# Patient Record
Sex: Male | Born: 1990 | Race: White | Hispanic: No | Marital: Single | Smoking: Former smoker
Health system: Southern US, Community
[De-identification: ages and names within clinical notes are randomized; demographics above are authoritative.]

## PROBLEM LIST (undated history)

## (undated) DIAGNOSIS — K760 Fatty (change of) liver, not elsewhere classified: Secondary | ICD-10-CM

## (undated) DIAGNOSIS — K449 Diaphragmatic hernia without obstruction or gangrene: Secondary | ICD-10-CM

## (undated) DIAGNOSIS — K5090 Crohn's disease, unspecified, without complications: Secondary | ICD-10-CM

## (undated) DIAGNOSIS — F32A Depression, unspecified: Secondary | ICD-10-CM

## (undated) DIAGNOSIS — K219 Gastro-esophageal reflux disease without esophagitis: Secondary | ICD-10-CM

## (undated) DIAGNOSIS — F1910 Other psychoactive substance abuse, uncomplicated: Secondary | ICD-10-CM

## (undated) DIAGNOSIS — K589 Irritable bowel syndrome without diarrhea: Secondary | ICD-10-CM

## (undated) DIAGNOSIS — F329 Major depressive disorder, single episode, unspecified: Secondary | ICD-10-CM

## (undated) HISTORY — DX: Major depressive disorder, single episode, unspecified: F32.9

## (undated) HISTORY — DX: Irritable bowel syndrome, unspecified: K58.9

## (undated) HISTORY — DX: Crohn's disease, unspecified, without complications: K50.90

## (undated) HISTORY — DX: Other psychoactive substance abuse, uncomplicated: F19.10

## (undated) HISTORY — DX: Fatty (change of) liver, not elsewhere classified: K76.0

## (undated) HISTORY — DX: Depression, unspecified: F32.A

## (undated) HISTORY — DX: Diaphragmatic hernia without obstruction or gangrene: K44.9

## (undated) HISTORY — PX: APPENDECTOMY: SHX54

## (undated) HISTORY — DX: Gastro-esophageal reflux disease without esophagitis: K21.9

---

## 2011-01-04 DIAGNOSIS — K5090 Crohn's disease, unspecified, without complications: Secondary | ICD-10-CM

## 2011-01-04 HISTORY — DX: Crohn's disease, unspecified, without complications: K50.90

## 2011-08-29 ENCOUNTER — Other Ambulatory Visit (INDEPENDENT_AMBULATORY_CARE_PROVIDER_SITE_OTHER): Payer: PRIVATE HEALTH INSURANCE

## 2011-08-29 ENCOUNTER — Encounter: Payer: Self-pay | Admitting: Gastroenterology

## 2011-08-29 ENCOUNTER — Telehealth: Payer: Self-pay | Admitting: Gastroenterology

## 2011-08-29 ENCOUNTER — Ambulatory Visit (INDEPENDENT_AMBULATORY_CARE_PROVIDER_SITE_OTHER): Payer: PRIVATE HEALTH INSURANCE | Admitting: Gastroenterology

## 2011-08-29 VITALS — BP 118/64 | HR 88 | Ht 73.0 in | Wt 176.0 lb

## 2011-08-29 DIAGNOSIS — R109 Unspecified abdominal pain: Secondary | ICD-10-CM

## 2011-08-29 DIAGNOSIS — R197 Diarrhea, unspecified: Secondary | ICD-10-CM

## 2011-08-29 DIAGNOSIS — R933 Abnormal findings on diagnostic imaging of other parts of digestive tract: Secondary | ICD-10-CM

## 2011-08-29 LAB — BASIC METABOLIC PANEL
BUN: 13 mg/dL (ref 6–23)
Chloride: 104 mEq/L (ref 96–112)
GFR: 97.47 mL/min (ref >60.00)
Glucose, Bld: 106 mg/dL — ABNORMAL HIGH (ref 70–99)
Potassium: 4 mEq/L (ref 3.5–5.1)
Sodium: 141 mEq/L (ref 135–145)

## 2011-08-29 LAB — LIPASE: Lipase: 16 U/L (ref 11.0–59.0)

## 2011-08-29 LAB — CBC WITH DIFFERENTIAL/PLATELET
Basophils Absolute: 0 10*3/uL (ref 0.0–0.1)
HCT: 38.4 % — ABNORMAL LOW (ref 39.0–52.0)
Lymphs Abs: 1.8 10*3/uL (ref 0.7–4.0)
MCV: 78.7 fl (ref 78.0–100.0)
Monocytes Absolute: 0.7 10*3/uL (ref 0.1–1.0)
Monocytes Relative: 7.1 % (ref 3.0–12.0)
Neutrophils Relative %: 70.6 % (ref 43.0–77.0)
Platelets: 340 10*3/uL (ref 150.0–400.0)
RDW: 12.5 % (ref 11.5–14.6)

## 2011-08-29 LAB — HEPATIC FUNCTION PANEL
ALT: 13 U/L (ref 0–53)
AST: 14 U/L (ref 0–37)
Albumin: 3.6 g/dL (ref 3.5–5.2)
Alkaline Phosphatase: 48 U/L (ref 39–117)

## 2011-08-29 LAB — TSH: TSH: 1.18 u[IU]/mL (ref 0.35–5.50)

## 2011-08-29 MED ORDER — MOVIPREP 100 G PO SOLR: 1.0000 | Freq: Once | ORAL | Status: DC

## 2011-08-29 NOTE — Progress Notes (Addendum)
History of Present Illness: This is a 21 year old male who relates a history of suprapubic abdominal pain for about 2 years. He underwent appendectomy in 2010 and he relates that his ongoing abdominal pain started 2011. He underwent a CT scan of the abdomen/pelvis in January 2013 in [place]-he states that exam was unremarkable. He saw his surgeon at that time and was placed on ibuprofen which he took regularly for several months and eventually he developed epigastric pain, nausea and anorexia. He saw his PCP was advised to discontinue ibuprofen and begin Nexium samples and then a prescription for pantoprazole. Since beginning Nexium his nausea, upper abdominal pain and anorexia have begun to resolve. He relates occasional episodes looser stools occurring once or twice a month but this did not correlate with his lower abdominal pain. His lower abdominal pain does not correlate with time of day, bowel movements, exercise or any other function. He states that his abdomen will hurt for several days at a time and then may not hurt for several days and then his symptoms returned. He was recently started on amitriptyline for the past 5 or 6 days with no improvement in symptoms. Denies weight loss, constipation, change in stool caliber, melena, hematochezia, vomiting, dysphagia, reflux symptoms, chest pain.  No Known Allergies No outpatient prescriptions prior to visit.   Past Medical History  Diagnosis Date  . IBS (irritable bowel syndrome)   . GERD (gastroesophageal reflux disease)    Past Surgical History  Procedure Date  . Appendectomy    History   Social History  . Marital Status: Single    Spouse Name: N/A    Number of Children: 0  . Years of Education: N/A   Occupational History  . Customer Service    Social History Main Topics  . Smoking status: Current Everyday Smoker -- 1.0 packs/day for 5 years    Types: Cigarettes  . Smokeless tobacco: Never Used  . Alcohol Use: No  . Drug Use:  No  . Sexually Active: None   Other Topics Concern  . None   Social History Narrative   Daily caffeine    Family History  Problem Relation Age of Onset  . Heart disease Father   . Diabetes type II Father   . Depression Father   . Esophageal cancer Mother   . Colon cancer Neg Hx     Review of Systems: Pertinent positive and negative review of systems were noted in the above HPI section. All other review of systems were otherwise negative.   Physical Exam: General: Well developed , well nourished, no acute distress Head: Normocephalic and atraumatic Eyes:  sclerae anicteric, EOMI Ears: Normal auditory acuity Mouth: No deformity or lesions Neck: Supple, no masses or thyromegaly Lungs: Clear throughout to auscultation Heart: Regular rate and rhythm; no murmurs, rubs or bruits Abdomen: Soft, mild suprapubic tenderness to deep palpation without rebound or guarding and non distended. No masses, hepatosplenomegaly or hernias noted. Normal Bowel sounds Musculoskeletal: Symmetrical with no gross deformities  Skin: No lesions on visible extremities Pulses:  Normal pulses noted Extremities: No clubbing, cyanosis, edema or deformities noted Neurological: Alert oriented x 4, grossly nonfocal Cervical Nodes:  No significant cervical adenopathy Inguinal Nodes: No significant inguinal adenopathy Psychological:  Alert and cooperative. Normal mood and affect  CT scan 01/24/2011 report received and reviewed: Diffuse distal ileum and terminal ileum thickening  Assessment and Recommendations:  1. Abnormal ileum on CT. Suprapubic pain. R/O ileitis, Crohn's. Possible painful adhesions. His symptoms are  not typical for irritable bowel syndrome. Obtain blood work and stool Hemoccults today. Continue amitriptyline. Schedule colonoscopy. The risks, benefits, and alternatives to colonoscopy with possible biopsy and possible polypectomy were discussed with the patient and they consent to proceed.   2.  Upper abdominal pain, nausea, anorexia. Presumably NSAID-induced gastritis or ulcer disease. Possible symptoms from ileitis, possible obstructive symptoms. Continue to avoid aspirin and NSAIDs. May use Tylenol as needed. Continue Nexium and then change to pantoprazole as recommended. Schedule upper endoscopy. The risks, benefits, and alternatives to endoscopy with possible biopsy and possible dilation were discussed with the patient and they consent to proceed.

## 2011-08-29 NOTE — Telephone Encounter (Signed)
Pt called and said his moviprep was 94.00 can he have something less expensive. I told him I will leave him a sample at our front desk.  Pt said he will come in tomorrow to pick it up.

## 2011-08-29 NOTE — Patient Instructions (Addendum)
Your physician has requested that you go to the basement for the following lab work before leaving today:TSH, Lipase, LFT's, Celiac panel, CBC, Bmet. You have been scheduled for an endoscopy and colonoscopy with propofol. Please follow the written instructions given to you at your visit today. Please pick up your prep at the pharmacy within the next 1-3 days. If you use inhalers (even only as needed), please bring them with you on the day of your procedure.  Please follow instructions on Hemoccult cards and mail them back to [place] when finished.  After you have finished taking the Nexium samples, please start taking the pantoprazole daily. Discontinue all aspiring and NSAID products.  cc: Birdena Jubilee, MD

## 2011-08-30 LAB — CELIAC PANEL 10: IgA: 210 mg/dL (ref 68–379)

## 2011-08-30 LAB — C-REACTIVE PROTEIN: CRP: 6.9 mg/dL (ref 0.5–20.0)

## 2011-09-20 ENCOUNTER — Ambulatory Visit (AMBULATORY_SURGERY_CENTER): Payer: PRIVATE HEALTH INSURANCE | Admitting: Gastroenterology

## 2011-09-20 ENCOUNTER — Encounter: Payer: Self-pay | Admitting: Gastroenterology

## 2011-09-20 VITALS — BP 137/83 | HR 108 | Temp 98.0°F | Resp 22 | Ht 73.0 in | Wt 160.0 lb

## 2011-09-20 DIAGNOSIS — K5289 Other specified noninfective gastroenteritis and colitis: Secondary | ICD-10-CM

## 2011-09-20 DIAGNOSIS — R109 Unspecified abdominal pain: Secondary | ICD-10-CM

## 2011-09-20 DIAGNOSIS — R197 Diarrhea, unspecified: Secondary | ICD-10-CM

## 2011-09-20 DIAGNOSIS — K2990 Gastroduodenitis, unspecified, without bleeding: Secondary | ICD-10-CM

## 2011-09-20 DIAGNOSIS — R933 Abnormal findings on diagnostic imaging of other parts of digestive tract: Secondary | ICD-10-CM

## 2011-09-20 DIAGNOSIS — K2970 Gastritis, unspecified, without bleeding: Secondary | ICD-10-CM

## 2011-09-20 MED ORDER — BUDESONIDE 3 MG PO CP24: 9.0000 mg | ORAL_CAPSULE | ORAL | Status: DC

## 2011-09-20 MED ORDER — SODIUM CHLORIDE 0.9 % IV SOLN: 500.0000 mL | INTRAVENOUS | Status: DC

## 2011-09-20 NOTE — Progress Notes (Signed)
The pt tolerated the colonoscopy very well. Maw   

## 2011-09-20 NOTE — Op Note (Signed)
[facility] [address] [place], [address]   COLONOSCOPY PROCEDURE REPORT  PATIENT: Manuel Hanna, Manuel Hanna  MR#: [id number] BIRTHDATE: May 24, 1990 , 20  yrs. old GENDER: Male ENDOSCOPIST: Meryl Dare, MD, [facility] REFERRED OZ:HYQMV Alberteen Sam, M.D. PROCEDURE DATE:  09/20/2011 PROCEDURE:   Colonoscopy with biopsy ASA CLASS:   Class II INDICATIONS:abdominal pain, an abnormal CT, and unexplained diarrhea. MEDICATIONS: MAC sedation, administered by CRNA and propofol (Diprivan) 500mg  IV  DESCRIPTION OF PROCEDURE:   After the risks benefits and alternatives of the procedure were thoroughly explained, informed consent was obtained.  A digital rectal exam revealed no abnormalities of the rectum.   The LB [id number]  endoscope was introduced through the anus and advanced to the terminal ileum which was intubated for a short distance. No adverse events experienced.   The quality of the prep was adequate, using MoviPrep The instrument was then slowly withdrawn as the colon was fully examined.   COLON FINDINGS: Moderate ileitis was found in the terminal ileum. Stricturing prevented deep intubation. Ulcerations, erosions and edema noted. Multiple biopsies of the area were performed. Traversable stenosis (narrowing) was found in the sigmoid colon. Erosions, induration noted. Multiple biopsies of the area were performed. The colon was otherwise normal.  There was no diverticulosis, inflammation, polyps or cancers unless previously stated.  Retroflexed views revealed no abnormalities. The time to cecum=4 minutes 19 seconds.  Withdrawal time=17 minutes 06 seconds. The scope was withdrawn and the procedure completed. COMPLICATIONS: There were no complications.  ENDOSCOPIC IMPRESSION: 1.   Ileitis, moderate; multiple biopsies performed 2.   Stenosis (narrowing) in the sigmoid colon suspected colitis; multiple biopsies performed 3.   The colon was otherwise  normal  RECOMMENDATIONS: 1.  Hold aspirin, aspirin products, and anti-inflammatory medications 2.  Await pathology results 3.  Entocord 3 mg, 3 po daily, #90, 3 refills 3.  Out patient follow-up in 4-6 weeks  eSigned:  Meryl Dare, MD, [facility] 09/20/2011 4:01 PM

## 2011-09-20 NOTE — Progress Notes (Signed)
The pt tolerated the egd very well. maw

## 2011-09-20 NOTE — Patient Instructions (Addendum)

## 2011-09-20 NOTE — Progress Notes (Signed)
Patient did not experience any of the following events: a burn prior to discharge; a fall within the facility; wrong site/side/patient/procedure/implant event; or a hospital transfer or hospital admission upon discharge from the facility. (G8907) Patient did not have preoperative order for IV antibiotic SSI prophylaxis. (G8918)  

## 2011-09-20 NOTE — Progress Notes (Signed)
15:55 Marrion Coy, CRNA hung 2nd bag of normal saline 0.9% 500 ml. Maw

## 2011-09-20 NOTE — Op Note (Signed)
[facility] [address] [place], [address]   ENDOSCOPY PROCEDURE REPORT  PATIENT: Manuel Hanna, Manuel Hanna  MR#: [id number] BIRTHDATE: 15-Apr-1990 , 20  yrs. old GENDER: Male ENDOSCOPIST: Meryl Dare, MD, [facility] REFERRED BY:  Birdena Jubilee, M.D. PROCEDURE DATE:  09/20/2011 PROCEDURE:  EGD w/ biopsy ASA CLASS:     Class II INDICATIONS:  abdominal pain. MEDICATIONS: MAC sedation, administered by CRNA, residual sedation effect present from prior procedure, and propofol (Diprivan) 250mg  IV TOPICAL ANESTHETIC: Cetacaine Spray DESCRIPTION OF PROCEDURE: After the risks benefits and alternatives of the procedure were thoroughly explained, informed consent was obtained.  The [facility] GIF-H180 [id number] endoscope was introduced through the mouth and advanced to the second portion of the duodenum. Without limitations.  The instrument was slowly withdrawn as the mucosa was fully examined.   STOMACH: Moderate gastritis (inflammation) was found in the gastric fundus.  Multiple biopsies were performed.   The stomach otherwise appeared normal. ESOPHAGUS: The mucosa of the esophagus appeared normal. DUODENUM: The duodenal mucosa showed no abnormalities in the bulb and second portion of the duodenum.  Retroflexed views revealed a hiatal hernia.     The scope was then withdrawn from the patient and the procedure completed.  COMPLICATIONS: There were no complications. ENDOSCOPIC IMPRESSION: 1.   Gastritis (inflammation) was found in the gastric fundus; multiple biopsies 2.   The stomach otherwise appeared normal 3.   The mucosa of the esophagus appeared normal 4.   The duodenal mucosa showed no abnormalities in the bulb and second portion of the duodenum  RECOMMENDATIONS: 1.  anti-reflux regimen 2.  Avoid ASA/NSAIDS 3.  await pathology results 4.  continue PPI    eSigned:  Meryl Dare, MD, [facility] 09/20/2011 4:09 PM

## 2011-09-21 ENCOUNTER — Telehealth: Payer: Self-pay

## 2011-09-21 ENCOUNTER — Telehealth: Payer: Self-pay | Admitting: Gastroenterology

## 2011-09-21 MED ORDER — PREDNISONE 10 MG PO TABS: ORAL_TABLET | ORAL | Status: DC

## 2011-09-21 NOTE — Telephone Encounter (Signed)
Left a message for patient to return my call. 

## 2011-09-21 NOTE — Telephone Encounter (Signed)
Patient states he does not really have a prescription coverage plan so his generic Entocort was going to be over a thousand dollars and he cannot afford this medication. Told patient there is not many other medications that are like Budesonide. Told patient maybe we can send in Uceris to see if this is any cheaper for patient if this is ok with Dr. Russella Dar. Please advise.

## 2011-09-21 NOTE — Telephone Encounter (Signed)
  Follow up Call-  Call back number 09/20/2011  Post procedure Call Back phone  # [phone]  Permission to leave phone message Yes     Patient questions:  Do you have a fever, pain , or abdominal swelling? no Pain Score  0 *  Have you tolerated food without any problems? yes  Have you been able to return to your normal activities? yes  Do you have any questions about your discharge instructions: Diet   no Medications  no Follow up visit  no  Do you have questions or concerns about your Care? no  Actions: * If pain score is 4 or above: No action needed, pain <4.  The pt said the rx prescribed yesterday cost too much and would like another rx.  I asked the pt to call the 3rd floor and leave a message for Dr. Russella Dar.maw

## 2011-09-21 NOTE — Telephone Encounter (Signed)
Prednisone 60 mg daily for 4 days, then 40 mg po qd for 2 weeks and then decrease to 30 mg qd. Will modify at REV.

## 2011-09-22 NOTE — Telephone Encounter (Signed)
Patient notified of tapered prednisone dose and prescription sent to the pharmacy. Told patient to stay on 30 mg dose until office visit which we scheduled for 10/25/11 at 9:15am. Pt agreed and verbalized understanding.

## 2011-09-27 ENCOUNTER — Encounter: Payer: Self-pay | Admitting: Gastroenterology

## 2011-10-12 ENCOUNTER — Telehealth: Payer: Self-pay | Admitting: Gastroenterology

## 2011-10-12 NOTE — Telephone Encounter (Signed)
Left a message for patient to return my call. 

## 2011-10-12 NOTE — Telephone Encounter (Signed)
Dr. Alberteen Sam called and I returned her call at 1600. Pt was seen by her last Friday having ongoing abdominal pain. She added Cipro, Flagyl and Bentyl. We will contact him to reassess and see if he needs a sooner appt., possibly a work in with one of our extenders. Increase his Prednisone to 50 mg po daily for 5 days and then 40 mg po daily until return visit with me. Take Cipro and Flagyl for 10 days and then discontinue. OK to continue to use Bentyl if it helps.

## 2011-10-13 NOTE — Telephone Encounter (Signed)
Patient states he has not started the Cipro and Flagyl because he wanted to wait until he spoke to our office. He is still having intermittant abdominal pain. Told him to start the antibiotics for 10 days and increase his prednisone according to Dr. Ardell Isaacs recommendations. Told him to continue Bentyl if this does help. Patient does have an appointment with you on 10/25/11. Told patient we can work him in with an PA if his symptoms do not improve once starting the antibiotics. Patient agreed and will call back if his symptoms do get worse or fail to improve after increasing the prednisone or taking the Cipro and Flagyl.

## 2011-10-25 ENCOUNTER — Other Ambulatory Visit (INDEPENDENT_AMBULATORY_CARE_PROVIDER_SITE_OTHER): Payer: PRIVATE HEALTH INSURANCE

## 2011-10-25 ENCOUNTER — Encounter: Payer: Self-pay | Admitting: Gastroenterology

## 2011-10-25 ENCOUNTER — Ambulatory Visit (INDEPENDENT_AMBULATORY_CARE_PROVIDER_SITE_OTHER): Payer: PRIVATE HEALTH INSURANCE | Admitting: Gastroenterology

## 2011-10-25 VITALS — BP 120/72 | HR 100 | Ht 72.0 in | Wt 179.4 lb

## 2011-10-25 DIAGNOSIS — K5080 Crohn's disease of both small and large intestine without complications: Secondary | ICD-10-CM

## 2011-10-25 DIAGNOSIS — R109 Unspecified abdominal pain: Secondary | ICD-10-CM

## 2011-10-25 MED ORDER — PREDNISONE 10 MG PO TABS: ORAL_TABLET | ORAL | Status: DC

## 2011-10-25 NOTE — Progress Notes (Signed)
History of Present Illness: This is a 21 year old male here today with his mother. He was recently diagnosed with Crohn's ileocolitis. He was unable to afford budesonide. He has no prescription medication coverage. He's currently taking prednisone 40 mg daily and he has had a substantial improvement. He's gained 20 pounds. His abdominal pain is only mild and intermittent occurring maybe once or twice a week in the periumbilical and suprapubic areas. His bowel movements are well-formed and he is sleeping well.  Current Medications, Allergies, Past Medical History, Past Surgical History, Family History and Social History were reviewed in [employer] record.  Physical Exam: General: Well developed , well nourished, no acute distress Head: Normocephalic and atraumatic Eyes:  sclerae anicteric, EOMI Ears: Normal auditory acuity Mouth: No deformity or lesions Lungs: Clear throughout to auscultation Heart: Regular rate and rhythm; no murmurs, rubs or bruits Abdomen: Soft, non tender and non distended. No masses, hepatosplenomegaly or hernias noted. Normal Bowel sounds Musculoskeletal: Symmetrical with no gross deformities  Pulses:  Normal pulses noted Extremities: No clubbing, cyanosis, edema or deformities noted Neurological: Alert oriented x 4, grossly nonfocal Psychological:  Alert and cooperative. Normal mood and affect  Assessment and Recommendations:  1. Crohn's ileocolitis. Substantial improvement on prednisone. Unfortunately he cannot afford budesonide. Continue prednisone 40 mg daily. Consider tapering prednisone at his next office visit. Discussed 6-mercaptopurine it's risks, benefits and alternatives. Discussed therapy with biologics and their risks, benefits and alternatives. Due to cost he would prefer to avoid biologics at this time The patient is comfortable proceeding with . Will obtain TPMT enzyme activity levels from [facility]. Obtain B12 level. Followup office  visit 3 weeks.

## 2011-10-25 NOTE — Patient Instructions (Addendum)
You have been given a separate informational sheet regarding your tobacco use, the importance of quitting and local resources to help you quit.  Please go to our basement level to have your labs drawn today. We will call you with the results.

## 2011-10-28 LAB — VITAMIN B12: Vitamin B-12: 294 pg/mL (ref 211–911)

## 2011-11-02 ENCOUNTER — Other Ambulatory Visit: Payer: Self-pay

## 2011-11-02 MED ORDER — MERCAPTOPURINE 50 MG PO TABS: 75.0000 mg | ORAL_TABLET | Freq: Every day | ORAL | Status: DC

## 2011-11-02 NOTE — Progress Notes (Signed)
Ok to increase PPI to BID

## 2011-11-03 ENCOUNTER — Telehealth: Payer: Self-pay | Admitting: Gastroenterology

## 2011-11-03 MED ORDER — MERCAPTOPURINE 50 MG PO TABS: 75.0000 mg | ORAL_TABLET | Freq: Every day | ORAL | Status: DC

## 2011-11-03 NOTE — Telephone Encounter (Signed)
Prescription resent to [facility] in [place]. Left a message to notify patient that the prescription was sent.

## 2011-11-09 ENCOUNTER — Ambulatory Visit (INDEPENDENT_AMBULATORY_CARE_PROVIDER_SITE_OTHER): Payer: PRIVATE HEALTH INSURANCE | Admitting: Gastroenterology

## 2011-11-09 ENCOUNTER — Encounter: Payer: Self-pay | Admitting: Gastroenterology

## 2011-11-09 ENCOUNTER — Other Ambulatory Visit: Payer: Self-pay | Admitting: Gastroenterology

## 2011-11-09 VITALS — BP 110/80 | HR 103 | Ht 72.0 in | Wt 187.4 lb

## 2011-11-09 DIAGNOSIS — K5080 Crohn's disease of both small and large intestine without complications: Secondary | ICD-10-CM

## 2011-11-09 MED ORDER — PREDNISONE 10 MG PO TABS: ORAL_TABLET | ORAL | Status: DC

## 2011-11-09 NOTE — Telephone Encounter (Signed)
Prescription sent to [facility] pharmacy.

## 2011-11-09 NOTE — Progress Notes (Signed)
History of Present Illness: This is a 21 year old male with Crohn's ileocolitis is accompanied by his mother today. He has had substantial improvement in symptoms over the past several weeks on prednisone. He still notes intermittent mild suprapubic pain. His appetite has improved substantially and he has gained about 11 pounds. He started 6-MP last week. Denies weight loss, constipation, diarrhea, change in stool caliber, melena, hematochezia, nausea, vomiting, dysphagia, reflux symptoms, chest pain.  Current Medications, Allergies, Past Medical History, Past Surgical History, Family History and Social History were reviewed in [employer] record.  Physical Exam: General: Well developed , well nourished, no acute distress Head: Normocephalic and atraumatic Eyes:  sclerae anicteric, EOMI Ears: Normal auditory acuity Mouth: No deformity or lesions Lungs: Clear throughout to auscultation Heart: Regular rate and rhythm; no murmurs, rubs or bruits Abdomen: Soft, non tender and non distended. No masses, hepatosplenomegaly or hernias noted. Normal Bowel sounds Musculoskeletal: Symmetrical with no gross deformities  Pulses:  Normal pulses noted Extremities: No clubbing, cyanosis, edema or deformities noted Neurological: Alert oriented x 4, grossly nonfocal Psychological:  Alert and cooperative. Normal mood and affect  Assessment and Recommendations:  1. Crohn's ileocolitis. Clinical course has substantially improved. On corticosteroids and 6-MP. Decrease prednisone to 30 mg daily. He will need to continue prednisone for 3 months of 6-MP therapy and that we can taper corticosteroids. He was again offered the option of beginning a biologic and he declines. Obtain blood work one month from starting 6-MP. Return office visit 2 months.

## 2011-11-09 NOTE — Patient Instructions (Signed)
Please go directly to the basement to have your labs drawn on the week of December 2nd.  Decrease your prednisone to 30 mg daily until your office visit in 2 months. We have sent a new prescription for prednisone.

## 2011-11-17 ENCOUNTER — Telehealth: Payer: Self-pay | Admitting: Gastroenterology

## 2011-11-17 NOTE — Telephone Encounter (Signed)
Pt has been taking mercaptopurine for about at week and a half. States he noticed a rash starting 2-3 days ago. Pt states he has a red rash around his collar bone, around his neck, down to his shoulder blades. States it itches and hurts. Instructed pt to stop the medicine and that he may want to take some benadryl for the itching. Dr. Russella Dar please advise.

## 2011-11-18 NOTE — Telephone Encounter (Signed)
DC HBsAg and PPD (or the TB blood test) Discuss biologic med coverage available to him

## 2011-11-18 NOTE — Telephone Encounter (Signed)
Patient advised to stop 6 MP and list as an allergy.  Patient reports that after he has had a night to think about it he wonders if the rash was from chemicals at work.  He would like to try 6 MP again.  Discussed with Dr. Russella Dar, he is ok with patient trying the 6 MP after rash has been completely gone for 1 week.  The patient is warned that he will need benadryl available when he tries if again because if the reaction was to the medication it could be much worse when he tries it again.  He understands and wants to try.  He also understands that if the rash returns when he tries 6 MP again we will need to look at biologic therapy.  Will hold off on the labs and TB until we talk again in a few weeks

## 2011-12-06 ENCOUNTER — Telehealth: Payer: Self-pay

## 2011-12-06 NOTE — Telephone Encounter (Signed)
I called patient to check on his rash and inquire if he was able to resume .  He reports that he started back on 2 weeks ago and no problems.  He will be due for labs the week of 12/19/11.  He is scheduled for REV 01/13/12 2:00

## 2011-12-23 ENCOUNTER — Encounter: Payer: Self-pay | Admitting: Gastroenterology

## 2011-12-23 NOTE — Telephone Encounter (Signed)
Error

## 2011-12-26 ENCOUNTER — Other Ambulatory Visit (INDEPENDENT_AMBULATORY_CARE_PROVIDER_SITE_OTHER): Payer: PRIVATE HEALTH INSURANCE

## 2011-12-26 DIAGNOSIS — K5080 Crohn's disease of both small and large intestine without complications: Secondary | ICD-10-CM

## 2011-12-26 LAB — CBC WITH DIFFERENTIAL/PLATELET
Basophils Relative: 0.3 % (ref 0.0–3.0)
Eosinophils Absolute: 0 10*3/uL (ref 0.0–0.7)
HCT: 44.5 % (ref 39.0–52.0)
Hemoglobin: 14.9 g/dL (ref 13.0–17.0)
Lymphs Abs: 1.6 10*3/uL (ref 0.7–4.0)
MCHC: 33.5 g/dL (ref 30.0–36.0)
MCV: 85.3 fl (ref 78.0–100.0)
Monocytes Absolute: 0.6 10*3/uL (ref 0.1–1.0)
Neutro Abs: 14.4 10*3/uL — ABNORMAL HIGH (ref 1.4–7.7)
RBC: 5.22 Mil/uL (ref 4.22–5.81)

## 2011-12-26 LAB — COMPREHENSIVE METABOLIC PANEL WITH GFR
ALT: 55 U/L — ABNORMAL HIGH (ref 0–53)
AST: 19 U/L (ref 0–37)
Albumin: 4.4 g/dL (ref 3.5–5.2)
Alkaline Phosphatase: 36 U/L — ABNORMAL LOW (ref 39–117)
BUN: 12 mg/dL (ref 6–23)
CO2: 28 meq/L (ref 19–32)
Calcium: 9.6 mg/dL (ref 8.4–10.5)
Chloride: 102 meq/L (ref 96–112)
Creatinine, Ser: 0.9 mg/dL (ref 0.4–1.5)
GFR: 106.9 mL/min
Glucose, Bld: 158 mg/dL — ABNORMAL HIGH (ref 70–99)
Potassium: 4.6 meq/L (ref 3.5–5.1)
Sodium: 139 meq/L (ref 135–145)
Total Bilirubin: 0.7 mg/dL (ref 0.3–1.2)
Total Protein: 7.4 g/dL (ref 6.0–8.3)

## 2011-12-26 LAB — LIPASE: Lipase: 24 U/L (ref 11.0–59.0)

## 2011-12-27 ENCOUNTER — Other Ambulatory Visit: Payer: Self-pay | Admitting: *Deleted

## 2011-12-27 DIAGNOSIS — K5010 Crohn's disease of large intestine without complications: Secondary | ICD-10-CM

## 2012-01-09 ENCOUNTER — Other Ambulatory Visit: Payer: Self-pay

## 2012-01-09 MED ORDER — PREDNISONE 10 MG PO TABS: ORAL_TABLET | ORAL | Status: DC

## 2012-01-11 ENCOUNTER — Ambulatory Visit: Payer: PRIVATE HEALTH INSURANCE

## 2012-01-11 DIAGNOSIS — R7309 Other abnormal glucose: Secondary | ICD-10-CM

## 2012-01-11 DIAGNOSIS — K5010 Crohn's disease of large intestine without complications: Secondary | ICD-10-CM

## 2012-01-11 LAB — CBC WITH DIFFERENTIAL/PLATELET
Basophils Absolute: 0 10*3/uL (ref 0.0–0.1)
Eosinophils Relative: 0.3 % (ref 0.0–5.0)
HCT: 41.4 % (ref 39.0–52.0)
Lymphs Abs: 1.8 10*3/uL (ref 0.7–4.0)
MCV: 86.2 fl (ref 78.0–100.0)
Monocytes Absolute: 0.5 10*3/uL (ref 0.1–1.0)
Neutrophils Relative %: 83.7 % — ABNORMAL HIGH (ref 43.0–77.0)
Platelets: 291 10*3/uL (ref 150.0–400.0)
RDW: 15.1 % — ABNORMAL HIGH (ref 11.5–14.6)
WBC: 14.4 10*3/uL — ABNORMAL HIGH (ref 4.5–10.5)

## 2012-01-11 LAB — COMPREHENSIVE METABOLIC PANEL
ALT: 109 U/L — ABNORMAL HIGH (ref 0–53)
Albumin: 4.1 g/dL (ref 3.5–5.2)
Alkaline Phosphatase: 36 U/L — ABNORMAL LOW (ref 39–117)
CO2: 30 mEq/L (ref 19–32)
GFR: 106.86 mL/min (ref >60.00)
Glucose, Bld: 90 mg/dL (ref 70–99)
Potassium: 4.4 mEq/L (ref 3.5–5.1)
Sodium: 139 mEq/L (ref 135–145)
Total Protein: 7.3 g/dL (ref 6.0–8.3)

## 2012-01-12 ENCOUNTER — Other Ambulatory Visit: Payer: Self-pay

## 2012-01-13 ENCOUNTER — Ambulatory Visit: Payer: PRIVATE HEALTH INSURANCE | Admitting: Gastroenterology

## 2012-01-13 ENCOUNTER — Telehealth: Payer: Self-pay | Admitting: Gastroenterology

## 2012-01-13 NOTE — Telephone Encounter (Signed)
No charge. 

## 2012-01-13 NOTE — Telephone Encounter (Signed)
Dr. Russella Dar I know we added blood tests and an Ultrasound for Manuel Hanna to do yesterday. U want to see him after these results come back right so no charge right?

## 2012-01-17 ENCOUNTER — Other Ambulatory Visit: Payer: PRIVATE HEALTH INSURANCE

## 2012-01-17 ENCOUNTER — Ambulatory Visit (HOSPITAL_COMMUNITY)
Admission: RE | Admit: 2012-01-17 | Discharge: 2012-01-17 | Disposition: A | Discharge status: Home / Self Care | Payer: PRIVATE HEALTH INSURANCE | Source: Ambulatory Visit | Attending: Gastroenterology | Admitting: Gastroenterology

## 2012-01-17 DIAGNOSIS — R160 Hepatomegaly, not elsewhere classified: Secondary | ICD-10-CM | POA: Insufficient documentation

## 2012-01-17 DIAGNOSIS — R748 Abnormal levels of other serum enzymes: Secondary | ICD-10-CM | POA: Insufficient documentation

## 2012-01-17 LAB — HEPATITIS C ANTIBODY: HCV Ab: NEGATIVE

## 2012-01-18 LAB — HEPATITIS B SURFACE ANTIBODY, QUANTITATIVE: Hepatitis B-Post: 0.1 m[IU]/mL

## 2012-01-18 LAB — HEPATITIS A ANTIBODY, TOTAL: Hep A Total Ab: NEGATIVE

## 2012-01-31 ENCOUNTER — Ambulatory Visit (INDEPENDENT_AMBULATORY_CARE_PROVIDER_SITE_OTHER): Payer: PRIVATE HEALTH INSURANCE | Admitting: Gastroenterology

## 2012-01-31 ENCOUNTER — Encounter: Payer: Self-pay | Admitting: Gastroenterology

## 2012-01-31 ENCOUNTER — Other Ambulatory Visit (INDEPENDENT_AMBULATORY_CARE_PROVIDER_SITE_OTHER): Payer: PRIVATE HEALTH INSURANCE

## 2012-01-31 VITALS — BP 120/78 | HR 72 | Ht 72.0 in | Wt 204.0 lb

## 2012-01-31 DIAGNOSIS — R7401 Elevation of levels of liver transaminase levels: Secondary | ICD-10-CM

## 2012-01-31 DIAGNOSIS — K5080 Crohn's disease of both small and large intestine without complications: Secondary | ICD-10-CM

## 2012-01-31 DIAGNOSIS — K7689 Other specified diseases of liver: Secondary | ICD-10-CM

## 2012-01-31 DIAGNOSIS — K760 Fatty (change of) liver, not elsewhere classified: Secondary | ICD-10-CM

## 2012-01-31 LAB — COMPREHENSIVE METABOLIC PANEL
Albumin: 4.2 g/dL (ref 3.5–5.2)
Alkaline Phosphatase: 38 U/L — ABNORMAL LOW (ref 39–117)
BUN: 12 mg/dL (ref 6–23)
Calcium: 9.5 mg/dL (ref 8.4–10.5)
Chloride: 102 mEq/L (ref 96–112)
Creatinine, Ser: 0.9 mg/dL (ref 0.4–1.5)
Glucose, Bld: 111 mg/dL — ABNORMAL HIGH (ref 70–99)
Potassium: 4.5 mEq/L (ref 3.5–5.1)

## 2012-01-31 LAB — CBC WITH DIFFERENTIAL/PLATELET
Basophils Relative: 0.5 % (ref 0.0–3.0)
Eosinophils Relative: 0.5 % (ref 0.0–5.0)
Hemoglobin: 14.6 g/dL (ref 13.0–17.0)
Lymphocytes Relative: 12.9 % (ref 12.0–46.0)
Neutro Abs: 8.8 10*3/uL — ABNORMAL HIGH (ref 1.4–7.7)
Neutrophils Relative %: 78.8 % — ABNORMAL HIGH (ref 43.0–77.0)
RBC: 4.99 Mil/uL (ref 4.22–5.81)
WBC: 11.1 10*3/uL — ABNORMAL HIGH (ref 4.5–10.5)

## 2012-01-31 NOTE — Progress Notes (Signed)
History of Present Illness: This is a 22 year old male with Crohn's ileocolitis recently started on 6-MP and continuing on prednisone. He is accompanied by his mother. Mildly elevated LFTs within noted since beginning prednisone and 6 MP. He has no gastrointestinal complaints he states his weight is back to his baseline.  Current Medications, Allergies, Past Medical History, Past Surgical History, Family History and Social History were reviewed in [employer] record.  Physical Exam: General: Well developed , well nourished, no acute distress Head: Normocephalic and atraumatic Eyes:  sclerae anicteric, EOMI Ears: Normal auditory acuity Mouth: No deformity or lesions Lungs: Clear throughout to auscultation Heart: Regular rate and rhythm; no murmurs, rubs or bruits Abdomen: Soft, non tender and non distended. No masses, hepatosplenomegaly or hernias noted. Normal Bowel sounds Musculoskeletal: Symmetrical with no gross deformities  Pulses:  Normal pulses noted Extremities: No clubbing, cyanosis, edema or deformities noted Neurological: Alert oriented x 4, grossly nonfocal Psychological:  Alert and cooperative. Normal mood and affect  Assessment and Recommendations:  1. Crohn's ileocolitis. Clinical course has substantially improved. On 6-MP. Continue prednisone to 30 mg daily. Obtain blood work one month from starting 6-MP. Return office visit 1 months.  2. Elevated transaminases. New since beginning prednisone and 6 MP. Fatty liver noted on ultrasound. Hepatic serologies all negative. Monitor LFTs. Hopefully and they sent this elevated LFTs are secondary to steatosis they may improve off prednisone. 6 MP could also be the cause. Plan for a prednisone taper as outlined below and maintain 6-MP monotherapy. Return office is a one-month

## 2012-01-31 NOTE — Patient Instructions (Addendum)
You have been given a separate informational sheet regarding your tobacco use, the importance of quitting and local resources to help you quit.  Decrease your prednisone to 20 mg once daily x 5 days, then reduce to 10 mg once daily x 5 days, then reduce to 10 mg every other day x 10 days, then stop.  Stay on 6mp and current dose.   Your physician has requested that you go to the basement for the following lab work before leaving today: CBC, Cmet, Lipase.  cc: Birdena Jubilee, MD

## 2012-02-10 ENCOUNTER — Telehealth: Payer: Self-pay

## 2012-02-10 NOTE — Telephone Encounter (Signed)
Patient wants to know if he can get a flu shot while taking mercaptopurine and prednisone.

## 2012-02-10 NOTE — Telephone Encounter (Signed)
Patient advised he should have flu shot annually.  He is offered an appt here but he declines, he wants to go to a [facility] closer to home.

## 2012-02-14 ENCOUNTER — Telehealth: Payer: Self-pay | Admitting: Gastroenterology

## 2012-02-14 MED ORDER — MERCAPTOPURINE 50 MG PO TABS: 75.0000 mg | ORAL_TABLET | Freq: Every day | ORAL | Status: DC

## 2012-02-14 NOTE — Telephone Encounter (Signed)
Told patient that Dr. Russella Dar was thinking about decreasing 6mp at last office visit but we did get repeat blood work with his LFT's improving. Told him that Dr. Russella Dar wants to continue 6mp on current dose for right now until next office visit. Refill sent to patient's pharmacy.

## 2012-03-05 ENCOUNTER — Encounter: Payer: Self-pay | Admitting: Gastroenterology

## 2012-03-05 ENCOUNTER — Other Ambulatory Visit: Payer: Self-pay | Admitting: Gastroenterology

## 2012-03-05 ENCOUNTER — Other Ambulatory Visit (INDEPENDENT_AMBULATORY_CARE_PROVIDER_SITE_OTHER): Payer: PRIVATE HEALTH INSURANCE

## 2012-03-05 ENCOUNTER — Ambulatory Visit (INDEPENDENT_AMBULATORY_CARE_PROVIDER_SITE_OTHER): Payer: PRIVATE HEALTH INSURANCE | Admitting: Gastroenterology

## 2012-03-05 VITALS — BP 120/74 | HR 80 | Wt 208.2 lb

## 2012-03-05 DIAGNOSIS — K5080 Crohn's disease of both small and large intestine without complications: Secondary | ICD-10-CM

## 2012-03-05 LAB — CBC WITH DIFFERENTIAL/PLATELET
Basophils Relative: 1 % (ref 0.0–3.0)
Eosinophils Relative: 0.5 % (ref 0.0–5.0)
HCT: 38.6 % — ABNORMAL LOW (ref 39.0–52.0)
Hemoglobin: 13.2 g/dL (ref 13.0–17.0)
Lymphs Abs: 1.2 10*3/uL (ref 0.7–4.0)
MCV: 86.8 fl (ref 78.0–100.0)
Monocytes Absolute: 0.7 10*3/uL (ref 0.1–1.0)
Monocytes Relative: 6.5 % (ref 3.0–12.0)
Neutro Abs: 8.4 10*3/uL — ABNORMAL HIGH (ref 1.4–7.7)
Platelets: 271 10*3/uL (ref 150.0–400.0)
WBC: 10.5 10*3/uL (ref 4.5–10.5)

## 2012-03-05 LAB — COMPREHENSIVE METABOLIC PANEL
Alkaline Phosphatase: 45 U/L (ref 39–117)
BUN: 12 mg/dL (ref 6–23)
CO2: 27 mEq/L (ref 19–32)
Creatinine, Ser: 0.9 mg/dL (ref 0.4–1.5)
GFR: 109.43 mL/min (ref >60.00)
Glucose, Bld: 111 mg/dL — ABNORMAL HIGH (ref 70–99)
Sodium: 140 mEq/L (ref 135–145)
Total Bilirubin: 0.8 mg/dL (ref 0.3–1.2)
Total Protein: 7.1 g/dL (ref 6.0–8.3)

## 2012-03-05 LAB — LIPASE: Lipase: 20 U/L (ref 11.0–59.0)

## 2012-03-05 MED ORDER — MERCAPTOPURINE 50 MG PO TABS: 100.0000 mg | ORAL_TABLET | Freq: Every day | ORAL | Status: DC

## 2012-03-05 NOTE — Patient Instructions (Addendum)
Your physician has requested that you go to the basement for the following lab work before leaving today: CBC, Cmet, Lipase.   Increase your 6mp to (100mg ) 2 tablets by mouth daily. A new prescription has been sent to your pharmacy.   cc: Birdena Jubilee, MD

## 2012-03-05 NOTE — Progress Notes (Signed)
History of Present Illness: This is a 22 year old male with Crohn's ileocolitis. Prednisone was tapered and discontinued about 2 or 3 weeks ago. Over the past 1-2 weeks he iha noted a return of mild suprapubic pain and he has had occasional episodes of diarrhea. His LFTs have improved.  Current Medications, Allergies, Past Medical History, Past Surgical History, Family History and Social History were reviewed in [employer] record.  Physical Exam: General: Well developed , well nourished, no acute distress Head: Normocephalic and atraumatic Eyes:  sclerae anicteric, EOMI Ears: Normal auditory acuity Mouth: No deformity or lesions Lungs: Clear throughout to auscultation Heart: Regular rate and rhythm; no murmurs, rubs or bruits Abdomen: Soft, minimal suprapubic tenderness to deep palpation and non distended. No masses, hepatosplenomegaly or hernias noted. Normal Bowel sounds Musculoskeletal: Symmetrical with no gross deformities  Pulses:  Normal pulses noted Extremities: No clubbing, cyanosis, edema or deformities noted Neurological: Alert oriented x 4, grossly nonfocal Psychological:  Alert and cooperative. Normal mood and affect  Assessment and Recommendations:  1. Crohn's ileocolitis. Clinical course has substantially improved however since discontinuing prednisone he has mild recurrent suprapubic pain and occasional loose stools which are typical of his prior symptoms. Given his weight increase and recurrent symptoms will increase 6 MP to 100 mg daily. Consider resuming Entocort however he does not have insurance coverage for medications. I advised him if 6-MP alone cannot manage his disease we will probably recommend adding a biologic however he does not have insurance coverage for medications. Return office visit 1 month.   2. Elevated transaminases. Fatty liver noted on ultrasound. Hepatic serologies all negative. Monitor LFTs. Hopefully elevated LFTs are secondary  to steatosis they may improve off prednisone.6 MP could also be the cause. He has had a substantial weight increase while taking prednisone.

## 2012-04-09 ENCOUNTER — Encounter: Payer: Self-pay | Admitting: Gastroenterology

## 2012-04-09 ENCOUNTER — Ambulatory Visit (INDEPENDENT_AMBULATORY_CARE_PROVIDER_SITE_OTHER): Payer: PRIVATE HEALTH INSURANCE | Admitting: Gastroenterology

## 2012-04-09 ENCOUNTER — Other Ambulatory Visit (INDEPENDENT_AMBULATORY_CARE_PROVIDER_SITE_OTHER): Payer: PRIVATE HEALTH INSURANCE

## 2012-04-09 VITALS — BP 126/90 | HR 68 | Ht 72.0 in | Wt 204.8 lb

## 2012-04-09 DIAGNOSIS — K5090 Crohn's disease, unspecified, without complications: Secondary | ICD-10-CM

## 2012-04-09 LAB — CBC WITH DIFFERENTIAL/PLATELET
Basophils Relative: 0.3 % (ref 0.0–3.0)
Eosinophils Relative: 0.7 % (ref 0.0–5.0)
Hemoglobin: 14.4 g/dL (ref 13.0–17.0)
MCV: 87.8 fl (ref 78.0–100.0)
Monocytes Absolute: 0.5 10*3/uL (ref 0.1–1.0)
Neutro Abs: 6.5 10*3/uL (ref 1.4–7.7)
Neutrophils Relative %: 77.3 % — ABNORMAL HIGH (ref 43.0–77.0)
RBC: 4.88 Mil/uL (ref 4.22–5.81)
WBC: 8.5 10*3/uL (ref 4.5–10.5)

## 2012-04-09 LAB — COMPREHENSIVE METABOLIC PANEL
Albumin: 4.4 g/dL (ref 3.5–5.2)
Alkaline Phosphatase: 42 U/L (ref 39–117)
BUN: 9 mg/dL (ref 6–23)
Calcium: 9.4 mg/dL (ref 8.4–10.5)
Chloride: 104 mEq/L (ref 96–112)
Glucose, Bld: 114 mg/dL — ABNORMAL HIGH (ref 70–99)
Potassium: 4.6 mEq/L (ref 3.5–5.1)

## 2012-04-09 NOTE — Progress Notes (Signed)
History of Present Illness: This is a 22 year old male with a history of Crohn's ileocolitis returning for followup today accompanied by his mother. He relates occasional episodes of mild suprapubic pain and loose stools. The symptoms occur about one time per week. They are described as mild however his symptoms were under better control when he was on prednisone. 6 MP was increased to 100 mg daily one month ago. His weight is stable his appetite is good. His energy level is good. His symptoms do not impact his work or other activities.  Current Medications, Allergies, Past Medical History, Past Surgical History, Family History and Social History were reviewed in [employer] record.  Physical Exam: General: Well developed , well nourished, no acute distress Head: Normocephalic and atraumatic Eyes:  sclerae anicteric, EOMI Ears: Normal auditory acuity Mouth: No deformity or lesions Lungs: Clear throughout to auscultation Heart: Regular rate and rhythm; no murmurs, rubs or bruits Abdomen: Soft, non tender and non distended. No masses, hepatosplenomegaly or hernias noted. Normal Bowel sounds Musculoskeletal: Symmetrical with no gross deformities  Pulses:  Normal pulses noted Extremities: No clubbing, cyanosis, edema or deformities noted Neurological: Alert oriented x 4, grossly nonfocal Psychological:  Alert and cooperative. Normal mood and affect  Assessment and Recommendations:  1. Crohn's ileocolitis. Clinical course has substantially improved however since discontinuing prednisone he has mild recurrent suprapubic pain and occasional loose stools which are typical of his prior symptoms. Consider resuming Entocort if he can get the medication through a patient assistance program. We've also applied to get Humira through a patient assistance program. Return office visit in 2 month. If he can get Entocort I would recommend least an 8 week course while we await the three-month  point of his increased dose of mercaptopurine. If Entocort fails, or he is unable to get it, and if higher dose of 6-MP does not work will consider biologic therapy. Obtain blood work today.   2. Elevated transaminases. Fatty liver noted on ultrasound. Hepatic serologies all negative. Monitor LFTs. Hopefully elevated LFTs are secondary to steatosis they may improve off prednisone.6 MP could also be the cause. He has had a substantial weight increase while taking prednisone.

## 2012-04-09 NOTE — Patient Instructions (Addendum)
  You have been given a separate informational sheet regarding your tobacco use, the importance of quitting and local resources to help you quit.  Your physician has requested that you go to the basement for lab work before leaving today.  Please make a follow up visit with Dr. Russella Dar in two months.  Thank you for choosing [facility] and Dr. Russella Dar. ____________________________________________________________________________                                               We are excited to introduce MyChart, a new best-in-class service that provides you online access to important information in your electronic medical record. We want to make it easier for you to view your health information - all in one secure location - when and where you need it. We expect MyChart will enhance the quality of care and service we provide.  When you register for MyChart, you can:    View your test results.    Request appointments and receive appointment reminders via email.    Request medication renewals.    View your medical history, allergies, medications and immunizations.    Communicate with your physician's office through a password-protected site.    Conveniently print information such as your medication lists.  To find out if MyChart is right for you, please talk to a member of our clinical staff today. We will gladly answer your questions about this free health and wellness tool.  If you are age 14 or older and want a member of your family to have access to your record, you must provide written consent by completing a proxy form available at our office. Please speak to our clinical staff about guidelines regarding accounts for patients younger than age 5.  As you activate your MyChart account and need any technical assistance, please call the MyChart technical support line at [phone] [phone]) or email your question to [email or web address]. If you email your  question(s), please include your name, a return phone number and the best time to reach you.  If you have non-urgent health-related questions, you can send a message to our office through MyChart at [email or web address]. If you have a medical emergency, call 911.  Thank you for using MyChart as your new health and wellness resource!   MyChart licensed from [employer],  [id number]. Patents Pending.  cc: Birdena Jubilee, MD

## 2012-04-17 ENCOUNTER — Telehealth: Payer: Self-pay

## 2012-04-17 DIAGNOSIS — K5080 Crohn's disease of both small and large intestine without complications: Secondary | ICD-10-CM

## 2012-04-17 NOTE — Telephone Encounter (Signed)
He needs all the "prebiologic therapy" testing completed to be sure there are no contraindications to Humira and then ti recommend starting Humira if he is willing to proceed

## 2012-04-17 NOTE — Telephone Encounter (Signed)
Ok I will have come in to get PPD. Does he need the vaccine series since the antibody and antigen were negative for Hepatitis B. Also the Hepatitis A antibody was negative. Do you want him to have pneumovax as well?

## 2012-04-17 NOTE — Telephone Encounter (Signed)
Spoke with patient and informed him we will have come in to the office to have 1st Twinrix, PPD test and Pneuomax on Monday 04/23/12 at 10:30am. Also told him that when he gets his Humira in the mail to call our office to schedule a nurse visit to go over self administration of the medication and to keep it refrigerated before then. Pt agreed and verbalized understanding.

## 2012-04-17 NOTE — Telephone Encounter (Signed)
yes

## 2012-04-17 NOTE — Telephone Encounter (Addendum)
Informed patient that received the fax from Humira patient assistance program that he has been approved until 04/2013. Patient told me he got a letter stating Entocort patient assistance was not approved. Told patient I would send this information to Dr. Russella Dar to see if he wants him to start Humira since he will be getting it soon in the mail. Please advise.

## 2012-04-23 ENCOUNTER — Ambulatory Visit (INDEPENDENT_AMBULATORY_CARE_PROVIDER_SITE_OTHER): Payer: PRIVATE HEALTH INSURANCE | Admitting: Gastroenterology

## 2012-04-23 ENCOUNTER — Encounter: Payer: Self-pay | Admitting: *Deleted

## 2012-04-23 DIAGNOSIS — K5080 Crohn's disease of both small and large intestine without complications: Secondary | ICD-10-CM

## 2012-04-23 DIAGNOSIS — Z23 Encounter for immunization: Secondary | ICD-10-CM

## 2012-04-23 NOTE — Patient Instructions (Addendum)
We have given you a TB skin test today. Please make certain to come back to the office for a reading between 48-72 hours from now to avoid requiring repeat testing.  We have given you a pneumovax today. This should be good for 5 years.  We have given you your first Twinrix injection of 4 today. Your next injection is scheduled for 04/30/12 @ 10:30 am.

## 2012-04-25 ENCOUNTER — Telehealth: Payer: Self-pay | Admitting: Gastroenterology

## 2012-04-25 LAB — TB SKIN TEST
Induration: 0 mm
TB Skin Test: NEGATIVE

## 2012-04-25 MED ORDER — PREDNISONE 20 MG PO TABS: 40.0000 mg | ORAL_TABLET | Freq: Every day | ORAL | Status: DC

## 2012-04-25 NOTE — Telephone Encounter (Signed)
Patient notified and prescription sent to patient's pharmacy.

## 2012-04-25 NOTE — Telephone Encounter (Signed)
Patient states he wants to be scheduled for Humira teaching next week. Scheduled teaching on 05/03/12 at 10:00am. Pt also states his abdominal pain has gotten worse recently and states prednisone has worked in the past. Humira comes in next Wednesday.  Can we give prednisone in the interim?

## 2012-04-25 NOTE — Telephone Encounter (Signed)
Start Prednisone 40 mg po qd with 3 months of refills Start Humira per standard protocol Continue 

## 2012-04-30 ENCOUNTER — Ambulatory Visit (INDEPENDENT_AMBULATORY_CARE_PROVIDER_SITE_OTHER): Payer: PRIVATE HEALTH INSURANCE | Admitting: Gastroenterology

## 2012-04-30 DIAGNOSIS — Z23 Encounter for immunization: Secondary | ICD-10-CM

## 2012-05-03 ENCOUNTER — Ambulatory Visit: Payer: PRIVATE HEALTH INSURANCE | Admitting: Gastroenterology

## 2012-05-03 DIAGNOSIS — K5080 Crohn's disease of both small and large intestine without complications: Secondary | ICD-10-CM

## 2012-05-03 NOTE — Progress Notes (Signed)
Patient here today for Humira administration teaching.  The patient properly demonstrated self injection using aseptic technique of Humira.  He verbalized understanding when to take the next dosage, proper storage and that he should hold the medication if he is febrile or sick and call us for direction.  He understands to call for any additional questions or concerns.  Patient tolerated well.

## 2012-05-21 ENCOUNTER — Ambulatory Visit (INDEPENDENT_AMBULATORY_CARE_PROVIDER_SITE_OTHER): Payer: PRIVATE HEALTH INSURANCE | Admitting: Gastroenterology

## 2012-05-21 DIAGNOSIS — Z23 Encounter for immunization: Secondary | ICD-10-CM

## 2012-05-21 DIAGNOSIS — K760 Fatty (change of) liver, not elsewhere classified: Secondary | ICD-10-CM

## 2012-05-31 ENCOUNTER — Ambulatory Visit (INDEPENDENT_AMBULATORY_CARE_PROVIDER_SITE_OTHER): Payer: PRIVATE HEALTH INSURANCE | Admitting: Gastroenterology

## 2012-05-31 ENCOUNTER — Encounter: Payer: Self-pay | Admitting: Gastroenterology

## 2012-05-31 VITALS — BP 118/78 | HR 108 | Ht 73.0 in | Wt 208.0 lb

## 2012-05-31 DIAGNOSIS — K50818 Crohn's disease of both small and large intestine with other complication: Secondary | ICD-10-CM

## 2012-05-31 DIAGNOSIS — K5080 Crohn's disease of both small and large intestine without complications: Secondary | ICD-10-CM

## 2012-05-31 NOTE — Progress Notes (Signed)
History of Present Illness: This is a 22 year old male with Crohn's ileocolitis who was recently started on Humira. He decided to discontinue 6-MP his own. He was specifically advised by me and by Marchelle Folks to remain on 6-MP and Humira. Patient relates 6-MP cost concerns and concerns about using 6-MP and Humira together and therefore discontinued. He remains on prednisone 40 mg daily. He relates a substantial improvement in abdominal pain and he has mild persistent diarrhea.  Current Medications, Allergies, Past Medical History, Past Surgical History, Family History and Social History were reviewed in [employer] record.  Physical Exam: General: Well developed , well nourished, no acute distress Head: Normocephalic and atraumatic Eyes:  sclerae anicteric, EOMI Ears: Normal auditory acuity Mouth: No deformity or lesions Lungs: Clear throughout to auscultation Heart: Regular rate and rhythm; no murmurs, rubs or bruits Abdomen: Soft, non tender and non distended. No masses, hepatosplenomegaly or hernias noted. Normal Bowel sounds Musculoskeletal: Symmetrical with no gross deformities  Pulses:  Normal pulses noted Extremities: No clubbing, cyanosis, edema or deformities noted Neurological: Alert oriented x 4, grossly nonfocal Psychological:  Alert and cooperative. Normal mood and affect  Assessment and Recommendations:  1. Crohn's ileocolitis. Clinical course has substantially improved on Humira. The patient discontinued 6-MP on his own. We discussed the importance of coordinating care and clearly communicating about medications use with our office. For now will leave him off 6-MP due to his concerns. He understands my recommendations were for him to remain on Humira and 6-MP together for a period of time, likely one year or so before consideration of discontinuing medications. Begin prednisone taper as outlined below. He is to remain on current dosing and schedule with Humira.  Return office visit in 6 weeks.   2. Elevated transaminases. Fatty liver noted on ultrasound. Hepatic serologies all negative. Monitor LFTs. Hopefully elevated LFTs are secondary to steatosis they may improve off prednisone. 6 MP could also be the cause. He has had a substantial weight increase while taking prednisone.

## 2012-05-31 NOTE — Patient Instructions (Signed)
Decrease prednisone to 30 mg daily x 1 week, then reduce to 20 mg daily x 1 week, then reduce to 10 mg daily x 2 weeks, then take 10 mg every other day x 2 weeks, then stop.  cc: Birdena Jubilee, MD

## 2012-07-12 ENCOUNTER — Ambulatory Visit: Payer: PRIVATE HEALTH INSURANCE | Admitting: Gastroenterology

## 2012-07-24 ENCOUNTER — Ambulatory Visit: Payer: PRIVATE HEALTH INSURANCE | Admitting: Gastroenterology

## 2012-07-27 ENCOUNTER — Ambulatory Visit: Payer: PRIVATE HEALTH INSURANCE | Admitting: Gastroenterology

## 2012-07-31 ENCOUNTER — Encounter: Payer: Self-pay | Admitting: Family Medicine

## 2012-07-31 ENCOUNTER — Telehealth: Payer: Self-pay | Admitting: Gastroenterology

## 2012-07-31 ENCOUNTER — Ambulatory Visit (INDEPENDENT_AMBULATORY_CARE_PROVIDER_SITE_OTHER): Payer: PRIVATE HEALTH INSURANCE | Admitting: Family Medicine

## 2012-07-31 VITALS — BP 128/89 | HR 99 | Wt 228.0 lb

## 2012-07-31 DIAGNOSIS — K219 Gastro-esophageal reflux disease without esophagitis: Secondary | ICD-10-CM

## 2012-07-31 DIAGNOSIS — M25569 Pain in unspecified knee: Secondary | ICD-10-CM

## 2012-07-31 DIAGNOSIS — M25561 Pain in right knee: Secondary | ICD-10-CM

## 2012-07-31 DIAGNOSIS — M25562 Pain in left knee: Secondary | ICD-10-CM

## 2012-07-31 DIAGNOSIS — R609 Edema, unspecified: Secondary | ICD-10-CM

## 2012-07-31 MED ORDER — DICLOFENAC SODIUM 1 % TD GEL: TRANSDERMAL | Status: DC

## 2012-07-31 MED ORDER — FUROSEMIDE 20 MG PO TABS: ORAL_TABLET | ORAL | Status: DC

## 2012-07-31 MED ORDER — PANTOPRAZOLE SODIUM 40 MG PO TBEC: 40.0000 mg | DELAYED_RELEASE_TABLET | Freq: Every day | ORAL | Status: AC

## 2012-07-31 NOTE — Progress Notes (Signed)
  Subjective:    Patient ID: Manuel Hanna, male    DOB: 05-09-1990, 22 y.o.   MRN: [id number]  HPI  Manuel Hanna is here today to discuss the following issues:    1)  Bilateral Knee Pain:  He has been struggling with knee pain since he stopped taking prednisone about three weeks ago.  He feels that this problem is aggravated by his job duties.  He has been using knee pads and he has taken Tylenol which has not helped him very much.    2)  GERD:  He needs his Protonix refilled.     Review of Systems  Constitutional: Positive for activity change (His activity is limited due to his knee pain.  ). Negative for unexpected weight change.  HENT: Negative.   Respiratory: Negative for chest tightness and shortness of breath.   Cardiovascular: Positive for leg swelling. Negative for chest pain and palpitations.  Gastrointestinal: Negative for abdominal pain (Pain has improved.  ).  Genitourinary: Negative.   Musculoskeletal:       Bilateral knee pain  Neurological: Negative.   Hematological: Negative.   Psychiatric/Behavioral: Negative.     Past Medical History  Diagnosis Date  . GERD (gastroesophageal reflux disease)   . Fatty liver   . Crohn's disease 2013    ileocolitis  . Regional enteritis of unspecified site   . IBS (irritable bowel syndrome)   . Hiatal hernia   . Depression   . Substance abuse     Heroin     Family History  Problem Relation Age of Onset  . Heart disease Father   . Diabetes type II Father   . Depression Father   . Diabetes Father   . Asthma Father   . Esophageal cancer Mother   . Colon cancer Neg Hx     History   Social History Narrative   Parents:  Mother Cordelia Pen); Father Jesusita Oka)   Siblings:  Sister   Living Situation:  Lives with parents and sister.   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     Drug Use:   He has been struggling with a heroin addiction.  He is getting help with this now.      Alcohol:  Occasional                     Objective:   Physical Exam  Constitutional: He is oriented to person, place, and time. He appears well-nourished. No distress.  HENT:  Head: Normocephalic.  Eyes: No scleral icterus.  Neck: Neck supple. No thyromegaly present.  Cardiovascular: Normal rate, regular rhythm and normal heart sounds.  Exam reveals no gallop and no friction rub.   No murmur heard. Pulmonary/Chest: Breath sounds normal. No respiratory distress. He exhibits no tenderness.  Musculoskeletal: He exhibits edema (Mild edema ) and tenderness (Knee Pain ).  Neurological: He is alert and oriented to person, place, and time.  Skin: Skin is warm and dry. No rash noted.  Psychiatric: He has a normal mood and affect. His behavior is normal. Judgment and thought content normal.      Assessment & Plan:

## 2012-07-31 NOTE — Patient Instructions (Addendum)
1)  GERD - Stay on the Protonix.  2)  Knee Pain - Check into whether or not Dr. Russella Dar is OK with you applying Voltaren Gel.  You can also try some Glucosamine 1500 mg per day with Chondroitin.  Remember with every pound you lose, that takes 4 lbs of pressure off your knees.     3)  Edema - Take the diuretic as needed - be sure and supplement potassium (banana) ; Limit your intake of sodium.    Edema Edema is an abnormal build-up of fluids in tissues. Because this is partly dependent on gravity (water flows to the lowest place), it is more common in the legs and thighs (lower extremities). It is also common in the looser tissues, like around the eyes. Painless swelling of the feet and ankles is common and increases as a person ages. It may affect both legs and may include the calves or even thighs. When squeezed, the fluid may move out of the affected area and may leave a dent for a few moments. CAUSES   Prolonged standing or sitting in one place for extended periods of time. Movement helps pump tissue fluid into the veins, and absence of movement prevents this, resulting in edema.  Varicose veins. The valves in the veins do not work as well as they should. This causes fluid to leak into the tissues.  Fluid and salt overload.  Injury, burn, or surgery to the leg, ankle, or foot, may damage veins and allow fluid to leak out.  Sunburn damages vessels. Leaky vessels allow fluid to go out into the sunburned tissues.  Allergies (from insect bites or stings, medications or chemicals) cause swelling by allowing vessels to become leaky.  Protein in the blood helps keep fluid in your vessels. Low protein, as in malnutrition, allows fluid to leak out.  Hormonal changes, including pregnancy and menstruation, cause fluid retention. This fluid may leak out of vessels and cause edema.  Medications that cause fluid retention. Examples are sex hormones, blood pressure medications, steroid treatment, or  anti-depressants.  Some illnesses cause edema, especially heart failure, kidney disease, or liver disease.  Surgery that cuts veins or lymph nodes, such as surgery done for the heart or for breast cancer, may result in edema. DIAGNOSIS  Your caregiver is usually easily able to determine what is causing your swelling (edema) by simply asking what is wrong (getting a history) and examining you (doing a physical). Sometimes x-rays, EKG (electrocardiogram or heart tracing), and blood work may be done to evaluate for underlying medical illness. TREATMENT  General treatment includes:  Leg elevation (or elevation of the affected body part).  Restriction of fluid intake.  Prevention of fluid overload.  Compression of the affected body part. Compression with elastic bandages or support stockings squeezes the tissues, preventing fluid from entering and forcing it back into the blood vessels.  Diuretics (also called water pills or fluid pills) pull fluid out of your body in the form of increased urination. These are effective in reducing the swelling, but can have side effects and must be used only under your caregiver's supervision. Diuretics are appropriate only for some types of edema. The specific treatment can be directed at any underlying causes discovered. Heart, liver, or kidney disease should be treated appropriately. HOME CARE INSTRUCTIONS   Elevate the legs (or affected body part) above the level of the heart, while lying down.  Avoid sitting or standing still for prolonged periods of time.  Avoid putting anything  directly under the knees when lying down, and do not wear constricting clothing or garters on the upper legs.  Exercising the legs causes the fluid to work back into the veins and lymphatic channels. This may help the swelling go down.  The pressure applied by elastic bandages or support stockings can help reduce ankle swelling.  A low-salt diet may help reduce fluid  retention and decrease the ankle swelling.  Take any medications exactly as prescribed. SEEK MEDICAL CARE IF:  Your edema is not responding to recommended treatments. SEEK IMMEDIATE MEDICAL CARE IF:   You develop shortness of breath or chest pain.  You cannot breathe when you lay down; or if, while lying down, you have to get up and go to the window to get your breath.  You are having increasing swelling without relief from treatment.  You develop a fever over 102 F (38.9 C).  You develop pain or redness in the areas that are swollen.  Tell your caregiver right away if you have gained 3 lb/1.4 kg in 1 day or 5 lb/2.3 kg in a week. MAKE SURE YOU:   Understand these instructions.  Will watch your condition.  Will get help right away if you are not doing well or get worse. Document Released: 12/20/2004 Document Revised: 06/21/2011 Document Reviewed: 08/08/2007 [facility] Patient Information 2014 [place], [place].

## 2012-08-01 NOTE — Telephone Encounter (Signed)
voltaren gel was prescribed by his primary care for knee pain.  He has a history of

## 2012-08-01 NOTE — Telephone Encounter (Signed)
Discussed with Mike Gip PA ok for patient to use Voltaren gel for his knee pain.  i have left a detailed message as he requested.

## 2012-09-09 ENCOUNTER — Encounter: Payer: Self-pay | Admitting: Family Medicine

## 2012-09-09 DIAGNOSIS — R609 Edema, unspecified: Secondary | ICD-10-CM | POA: Insufficient documentation

## 2012-09-09 DIAGNOSIS — K219 Gastro-esophageal reflux disease without esophagitis: Secondary | ICD-10-CM | POA: Insufficient documentation

## 2012-09-09 DIAGNOSIS — M25561 Pain in right knee: Secondary | ICD-10-CM | POA: Insufficient documentation

## 2012-09-09 NOTE — Assessment & Plan Note (Signed)
Refilled his Protonix.

## 2012-09-09 NOTE — Assessment & Plan Note (Signed)
He was given a prescription for Voltaren Gel.

## 2012-09-09 NOTE — Assessment & Plan Note (Signed)
He was given a prescription for Lasix.

## 2013-02-20 ENCOUNTER — Telehealth: Payer: Self-pay

## 2013-02-20 NOTE — Telephone Encounter (Signed)
Received an application for patient's Humira assistance.  He is asked to call with an update and will need to fill out the application.  I have left a VM

## 2013-02-21 NOTE — Telephone Encounter (Signed)
Patient called back.  He is still taking Humira.  He will come for a office visit and follow up 03/20/13.  He is aware that his assistance is running out and will need to fill out the application again for [employer]

## 2013-03-06 ENCOUNTER — Ambulatory Visit (INDEPENDENT_AMBULATORY_CARE_PROVIDER_SITE_OTHER): Payer: Self-pay | Admitting: Family Medicine

## 2013-03-06 ENCOUNTER — Encounter: Payer: Self-pay | Admitting: Family Medicine

## 2013-03-06 VITALS — BP 144/84 | HR 90 | Resp 16 | Ht 73.0 in | Wt 206.0 lb

## 2013-03-06 DIAGNOSIS — K219 Gastro-esophageal reflux disease without esophagitis: Secondary | ICD-10-CM

## 2013-03-06 DIAGNOSIS — R112 Nausea with vomiting, unspecified: Secondary | ICD-10-CM

## 2013-03-06 DIAGNOSIS — K5090 Crohn's disease, unspecified, without complications: Secondary | ICD-10-CM | POA: Insufficient documentation

## 2013-03-06 DIAGNOSIS — F411 Generalized anxiety disorder: Secondary | ICD-10-CM

## 2013-03-06 DIAGNOSIS — IMO0001 Reserved for inherently not codable concepts without codable children: Secondary | ICD-10-CM

## 2013-03-06 DIAGNOSIS — F19939 Other psychoactive substance use, unspecified with withdrawal, unspecified: Secondary | ICD-10-CM

## 2013-03-06 MED ORDER — PROMETHAZINE HCL 25 MG PO TABS: ORAL_TABLET | ORAL | Status: AC

## 2013-03-06 MED ORDER — CYCLOBENZAPRINE HCL 5 MG PO TABS: 5.0000 mg | ORAL_TABLET | Freq: Every day | ORAL | Status: DC

## 2013-03-06 MED ORDER — TRAMADOL HCL 50 MG PO TABS: 50.0000 mg | ORAL_TABLET | Freq: Two times a day (BID) | ORAL | Status: AC | PRN

## 2013-03-06 MED ORDER — DULOXETINE HCL 60 MG PO CPEP: 60.0000 mg | ORAL_CAPSULE | Freq: Every day | ORAL | Status: AC

## 2013-03-06 MED ORDER — RANITIDINE HCL 150 MG PO TABS: 150.0000 mg | ORAL_TABLET | Freq: Two times a day (BID) | ORAL | Status: AC

## 2013-03-06 NOTE — Progress Notes (Signed)
Subjective:    Patient ID: Manuel Hanna Hanna, male    DOB: 06/13/1990, 23 y.o.   MRN: [id number]  HPI  Manuel Hanna is here today complaining of nausea, vomiting and diarrhea which he has been having for about 2 weeks. He went to [facility] and was given phenergan. He has been taking this but is still having the nausea and vomiting. He has missed work on and off for the past two weeks. He is concerned that something more serious could be going on.    Review of Systems  Constitutional: Positive for chills, activity change and appetite change.  HENT: Negative.   Eyes: Negative.   Respiratory: Negative.   Gastrointestinal: Positive for nausea, vomiting, abdominal pain and diarrhea.  Endocrine: Negative.   Genitourinary: Negative.   Neurological: Negative.   Hematological: Negative.   All other systems reviewed and are negative.    Past Medical History  Diagnosis Date  . GERD (gastroesophageal reflux disease)   . Fatty liver   . Crohn's disease 2013    ileocolitis  . Regional enteritis of unspecified site   . IBS (irritable bowel syndrome)   . Hiatal hernia   . Depression   . Substance abuse     Heroin      Past Surgical History  Procedure Laterality Date  . Appendectomy       History   Social History Narrative   Parents:  Mother Cordelia Pen(Sherry); Father Jesusita Oka(Dan)   Siblings:  Sister   Living Situation:  Lives with parents and sister.   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 He stopped smoking in 09/2012    Drug Use:  He has been struggling with a heroin addiction.  He is getting help with this now.      Alcohol:  Occasional                       Family History  Problem Relation Age of Onset  . Heart disease Father   . Diabetes type II Father   . Depression Father   . Diabetes Father   . Asthma Father   .  Cancer Father   . Colon cancer Father   . Esophageal cancer Mother      Current Outpatient Prescriptions on File Prior to Visit  Medication Sig Dispense Refill  . Adalimumab (HUMIRA PEN [place]) Inject 40 mg into the skin every 14 (fourteen) days.      . pantoprazole (PROTONIX) 40 MG tablet Take 1 tablet (40 mg total) by mouth daily.  180 tablet  1   No current facility-administered medications on file prior to visit.     Allergies  Allergen Reactions  . Shellfish Allergy Swelling     Immunization History  Administered Date(s) Administered  . Hep A / Hep B 04/23/2012, 04/30/2012, 05/21/2012, 04/24/2013  . PPD Test 04/23/2012, 03/27/2013  . Pneumococcal Polysaccharide-23 04/23/2012        Objective:   Physical Exam  Nursing note and vitals reviewed. Constitutional: He is oriented to person, place, and time. He appears well-nourished. No distress.  HENT:  Head: Normocephalic.  Eyes: No scleral icterus.  Neck: Neck supple. No thyromegaly present.  Cardiovascular: Normal rate, regular rhythm and normal heart sounds.  Exam reveals no gallop and no friction rub.  No murmur heard. Pulmonary/Chest: Breath sounds normal. No respiratory distress. He exhibits no tenderness.  Musculoskeletal: Edema: Mild edema  Tenderness: Knee Pain   Neurological: He is alert and oriented to person, place, and time.  Skin: Skin is warm and dry. No rash noted.  Psychiatric: He has a normal mood and affect. His behavior is normal. Judgment and thought content normal.      Assessment & Plan:   Manuel Hanna was seen today for nausea and emesis.  Diagnoses and associated orders for this visit:  Nausea with vomiting - promethazine (PHENERGAN) 25 MG tablet; Take 1/4 - 1 tab po q 4-6 hours  Myalgia and myositis Comments: He was given medications for his generalized pain.   - traMADol (ULTRAM) 50 MG tablet; Take 1 tablet (50 mg total) by mouth every 12 (twelve) hours as needed. - Discontinue:  cyclobenzaprine (FLEXERIL) 5 MG tablet; Take 1 tablet (5 mg total) by mouth at bedtime. - DULoxetine (CYMBALTA) 60 MG capsule; Take 1 capsule (60 mg total) by mouth daily.  Anxiety state, unspecified Comments: He was given samples of Fetzima.  He is to alternate this drug with Cymbalta.   - DULoxetine (CYMBALTA) 60 MG capsule; Take 1 capsule (60 mg total) by mouth daily.  GERD (gastroesophageal reflux disease) - ranitidine (ZANTAC) 150 MG tablet; Take 1 tablet (150 mg total) by mouth 2 (two) times daily.  Drug withdrawal Comments: Manuel Hanna was on heroin and then got transitioned over to methadone.  He ended up just stopping going to the methadone clinic which most likely explains his symptoms.     TIME SPENT "FACE TO FACE" WITH PATIENT -  20 MINS

## 2013-03-06 NOTE — Patient Instructions (Addendum)
1)  Anxiety/Nervous Feeling - Fetzima 20 mg per day for 2 days then 40 mg.  Once you get the Cymbalta from [facility] alternate the Fetzima with the Cymbalta every other day then 2/1 then daily.    2)  Muscle Pain - Fetzima/Cymbalta - Ultram 50 mg take twice a day; Flexeril 5 mg at night - Hot Bath /Epsom Salt; Tylenol 1000 mg up to 3 times per day.    3)  Nausea - Phenergan/Zantac   Heroin Abuse and Withdrawal                           Heroin is an illegal opiate (narcotic) drug. It is very addictive. Once the effects of the drug are felt, the need to use the drug again (known as craving) is strong. When the drug is suddenly stopped after using it on a regular basis, significant and painful physical withdrawal symptoms are experienced.  EFFECTS OF HEROIN USE AND ABUSE The drug produces a profound feeling of pleasure and relaxation (euphoria) that lasts for a short time. This is followed by sleepiness and then agitation with craving for more drugs. After a short time of regular use (days or weeks), dependence on the drug is developed. This means the user will become ill without it (withdrawal) and needs to keep using the drug to function. This is how fast heroin abuse becomes physical dependence and addiction (chemical dependency). EFFECTS ON THE BRAIN Heroin is addictive because it activates regions of the brain that are responsible for producing both the pleasurable sensation of "reward" and physical dependence. Together, these actions account for the user's loss of control and the rapid development of drug dependence. HOW IT IS USED  Heroin is a drug that is mostly taken by a shot with a needle (injection) in the vein. Using a needle can have harmful effects on the user. Often times, the needle is unclean (unsterile), the heroin is contaminated with cutting agents, or heroin is used in combination with other drugs such as alcohol or cocaine.  In recent years, the purity of street  heroin has increased a lot allowing users to "snort" the drug into their noses where it is absorbed through the lining of the nasal passages. You can become addicted this way. Many IV heroin users report they started by snorting. RELATED PROBLEMS  Needle sharing can cause AIDS. The AIDS virus is carried in contaminated blood left in the needle, syringe or other drug-related equipment. When unclean needles are used, the AIDS virus is injected into the new user. There is no cure or proven vaccine for AIDS to prevent it.  Heroin use during pregnancy is associated with stillbirths. It can also increase the chance for miscarriage. Babies born addicted to heroin must undergo withdrawal after birth. These babies show a number of developmental problems.  Heroin use can cause serious health problems such as liver infection (hepatitis), skin abscesses, vein inflammation and heart disease. SYMPTOMS The signs and symptoms of heroin use include:  Euphoria.  Drowsiness.  Respiratory depression (which can progress until breathing stops).  Small (constricted) pupils and nausea.  Need for increasingly higher doses of the drug to get the same effect.  Weight loss.  "Track" marks (scars) on arms and legs.  Emotional instability. Symptoms of a heroin overdose include:   Shallow breathing.  Pinpoint pupils.  Clammy skin.  Convulsion.  Coma. WITHDRAWAL EFFECTS Withdrawal symptoms include:  Watery eyes.  Runny  nose.  Yawning.  Loss of appetite.  Tremors.  Panic.  Chills.  Sweating.  Nausea.  Muscle cramps.  Not being able to sleep well (insomnia).  Depression and mood swings.  Elevations in blood pressure, pulse, respiratory rate and temperature. RISK OF OVERDOSE Because the drug is produced illegally, heroin users risk taking an unusually potent dose (due to differences in purity). This can lead to overdose, coma and possible death. Especially dangerous is the situation  where a user has been "clean" (abstinent) from using the drug for a period of time and then relapses. Fatal overdoses can occur because the user fails to account for the change in the body's tolerance to the drug. TREATMENT  There are many treatment options for heroin addiction. They include medications as well as behavioral therapies. Research has demonstrated that many people can successfully stop using heroin when medication is combined with other supportive services. Patients can return to stable and productive lives, living completely free of all narcotics. Some of the medications and other treatment approaches used are:  Methadone. This is a medication that blocks the effects of heroin for about 24 hours. It has a proven record of success when prescribed at a high enough dosage level for people addicted to heroin.  Naloxone may be used to treat cases of overdose, as well as naltrexone. Both of these block the effects of morphine, heroin and other opiates. People who have become free of narcotics often take naltrexone to avoid relapse.  Buprenorphine is now available for treating addiction to heroin and other opiates. This medication offers less risk of addiction. It can be dispensed in the privacy of a doctor's office.  Several other medications for use in heroin treatment programs are also under study.  There are many effective behavioral treatments available for heroin addiction. These can include residential and outpatient approaches. 12-step programs, such as Narcotics Anonymous, have also proven to be effective. HOME CARE INSTRUCTIONS  Some individuals can successfully stop using heroin at home with medically assisted detoxification. This requires following your caregiver's instructions very carefully. This may include use of some of the following medications and therapies:  Stomach medicine can be used for the nausea.  Imodium can be used for the diarrhea.  Benadryl can be used for  the sleeplessness and restlessness.  Anxiety medication such as Xanax or Ativan. These must be administered exactly as prescribed, usually with the help of a home caregiver.  Avoiding dehydration by drinking more fluids than usual. While water alone is fine, any non-alcoholic fluid is okay (soup, juice, etc.).  Providing quiet, calm support and cooling or warmth as needed.  Call your local emergency services (911 in [place] if seizures occur or if the patient is unable to keep liquids down.  Keep a written record of medications given and times given. Addiction cannot be cured but it can be treated successfully.   Treatment centers are listed in the yellow pages under:  Substance Abuse.  Narcotics Anonymous.  Drug Abuse Counseling.  Most hospitals and clinics can refer you to a specialized care center.  The [place] government maintains a toll-free number for obtaining treatment referrals:1-(810)319-9167 or 726-361-7715 (TDD) and maintains a website: http://findtreatment.RockToxic.pl.  Other websites for additional information are:  www.mentalhealth.RockToxic.pl.  GreatestFeeling.tn.  In [place] treatment resources are listed in each [place] with listings available under:  Oncologist for [employer] or similar titles. Document Released: 12/23/2002 Document Revised: 03/14/2011 Document Reviewed: 12/07/2007 [facility] Patient Information 2014 [place], [place].

## 2013-03-20 ENCOUNTER — Other Ambulatory Visit (INDEPENDENT_AMBULATORY_CARE_PROVIDER_SITE_OTHER): Payer: Self-pay

## 2013-03-20 ENCOUNTER — Encounter: Payer: Self-pay | Admitting: Gastroenterology

## 2013-03-20 ENCOUNTER — Ambulatory Visit (INDEPENDENT_AMBULATORY_CARE_PROVIDER_SITE_OTHER): Payer: Self-pay | Admitting: Gastroenterology

## 2013-03-20 VITALS — BP 134/82 | HR 91 | Ht 73.0 in | Wt 212.4 lb

## 2013-03-20 DIAGNOSIS — K5080 Crohn's disease of both small and large intestine without complications: Secondary | ICD-10-CM

## 2013-03-20 LAB — COMPREHENSIVE METABOLIC PANEL
ALT: 51 U/L (ref 0–53)
AST: 24 U/L (ref 0–37)
Albumin: 4.4 g/dL (ref 3.5–5.2)
Alkaline Phosphatase: 57 U/L (ref 39–117)
BUN: 14 mg/dL (ref 6–23)
CALCIUM: 9.4 mg/dL (ref 8.4–10.5)
CHLORIDE: 101 meq/L (ref 96–112)
CO2: 30 mEq/L (ref 19–32)
Creatinine, Ser: 0.8 mg/dL (ref 0.4–1.5)
GFR: 131.53 mL/min (ref >60.00)
Glucose, Bld: 92 mg/dL (ref 70–99)
Potassium: 4.2 mEq/L (ref 3.5–5.1)
Sodium: 139 mEq/L (ref 135–145)
Total Bilirubin: 0.5 mg/dL (ref 0.3–1.2)
Total Protein: 7.7 g/dL (ref 6.0–8.3)

## 2013-03-20 LAB — CBC WITH DIFFERENTIAL/PLATELET
BASOS PCT: 0.5 % (ref 0.0–3.0)
Basophils Absolute: 0 10*3/uL (ref 0.0–0.1)
EOS ABS: 0.1 10*3/uL (ref 0.0–0.7)
EOS PCT: 1.2 % (ref 0.0–5.0)
HCT: 41.7 % (ref 39.0–52.0)
HEMOGLOBIN: 13.7 g/dL (ref 13.0–17.0)
LYMPHS PCT: 21.3 % (ref 12.0–46.0)
Lymphs Abs: 2.2 10*3/uL (ref 0.7–4.0)
MCHC: 32.9 g/dL (ref 30.0–36.0)
MCV: 84.7 fl (ref 78.0–100.0)
Monocytes Absolute: 0.8 10*3/uL (ref 0.1–1.0)
Monocytes Relative: 7.8 % (ref 3.0–12.0)
NEUTROS ABS: 7.1 10*3/uL (ref 1.4–7.7)
NEUTROS PCT: 69.2 % (ref 43.0–77.0)
Platelets: 226 10*3/uL (ref 150.0–400.0)
RBC: 4.92 Mil/uL (ref 4.22–5.81)
RDW: 12 % (ref 11.5–14.6)
WBC: 10.3 10*3/uL (ref 4.5–10.5)

## 2013-03-20 LAB — VITAMIN B12: Vitamin B-12: 341 pg/mL (ref 211–911)

## 2013-03-20 NOTE — Progress Notes (Addendum)
    History of Present Illness: This is a 23 year old male with Crohn's ileocolitis maintained on Humira. He was last seen in May. He did not return for recommended followup. States he's been compliant Humira every 2 weeks. He had nausea, vomiting, diarrhea for about 7-10 days several weeks ago and states he missed several days of work but he did not contact us at that time. He has felt perfectly well since then. His appetite is good his weight is stable he denies abdominal pain, diarrhea or rectal bleeding. He takes tramadol intermittently for knee pain. Takes pantoprazole every morning and ranitidine in the afternoon and evening if his reflux symptoms recur.  Current Medications, Allergies, Past Medical History, Past Surgical History, Family History and Social History were reviewed in [facility] electronic medical record.  Physical Exam: General: Well developed , well nourished, no acute distress Head: Normocephalic and atraumatic Eyes:  sclerae anicteric, EOMI Ears: Normal auditory acuity Mouth: No deformity or lesions Lungs: Clear throughout to auscultation Heart: Regular rate and rhythm; no murmurs, rubs or bruits Abdomen: Soft, non tender and non distended. No masses, hepatosplenomegaly or hernias noted. Normal Bowel sounds Musculoskeletal: Symmetrical with no gross deformities  Pulses:  Normal pulses noted Extremities: No clubbing, cyanosis, edema or deformities noted Neurological: Alert oriented x 4, grossly nonfocal Psychological:  Alert and cooperative. Normal mood and affect  Assessment and Recommendations:  1. Crohn's ileocolitis. Clinical course has substantially improved on Humira. He has not return for recommended followup. We discussed the importance of coordinating care, making follow up appts and clear communications about interim illnesses. Blood work today. Annual PPD. He requests paperwork to be completed for medical leave for his absence from work for his  self-limited gastroenteritis several weeks ago.  2. Elevated transaminases. Fatty liver noted on ultrasound. Hepatic serologies all negative. Monitor LFTs. Hopefully elevated LFTs are secondary to steatosis they will improve off prednisone. He has had a substantial weight increase while taking prednisone.   3. GERD. Continue standard antireflux measures and pantoprazole 40 mg every morning. He would like to take ranitidine 150 mg twice a day when necessary as well.

## 2013-03-20 NOTE — Patient Instructions (Signed)
Your physician has requested that you go to the basement for the following lab work before leaving today: CBC, Cmet, Vitamin b12.  Please bring your patient assistance paperwork for Humira back to our office.   Please come to your nurse visit on 03/27/13 at 2:00pm to get your PPD skin test placed.   Thank you for choosing me and [facility].  Venita Lick. Pleas Koch., MD., Clementeen Graham

## 2013-03-27 ENCOUNTER — Ambulatory Visit (INDEPENDENT_AMBULATORY_CARE_PROVIDER_SITE_OTHER): Payer: Self-pay | Admitting: Gastroenterology

## 2013-03-27 DIAGNOSIS — K5080 Crohn's disease of both small and large intestine without complications: Secondary | ICD-10-CM

## 2013-04-09 ENCOUNTER — Telehealth: Payer: Self-pay

## 2013-04-09 NOTE — Telephone Encounter (Signed)
Patient is scheduled for final Twinrix on 04/24/13 1:30.  He is aware of the appt

## 2013-04-09 NOTE — Telephone Encounter (Signed)
Message copied by Annett Fabian on Tue Apr 09, 2013  9:15 AM ------      Message from: Daphine Deutscher      Created: Mon May 21, 2012  3:53 PM       Patient is due for Twinrix #4 on 04/23/13. Needs to be scheduled. ------

## 2013-04-24 ENCOUNTER — Ambulatory Visit (INDEPENDENT_AMBULATORY_CARE_PROVIDER_SITE_OTHER): Payer: Self-pay | Admitting: Gastroenterology

## 2013-04-24 DIAGNOSIS — Z23 Encounter for immunization: Secondary | ICD-10-CM

## 2013-06-24 ENCOUNTER — Encounter: Payer: Self-pay | Admitting: Gastroenterology

## 2013-08-11 IMAGING — US US ABDOMEN COMPLETE
1 series · 14 of 25 positions shown · non-contrast
Comparison: none

Abdominal ultrasound
HISTORY: Abnormal liver enzymes

[Series 1: us abdomen complete · 0.33mm/px · 14 of 68 slices shown]
[im 1/68]
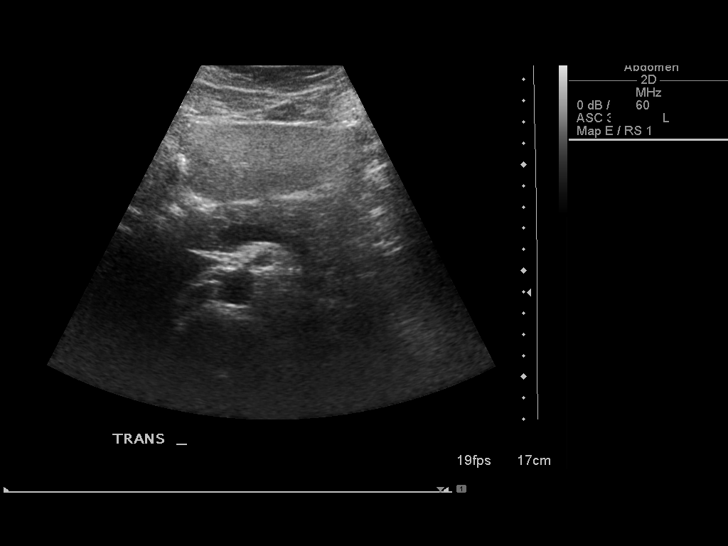
[im 6/68]
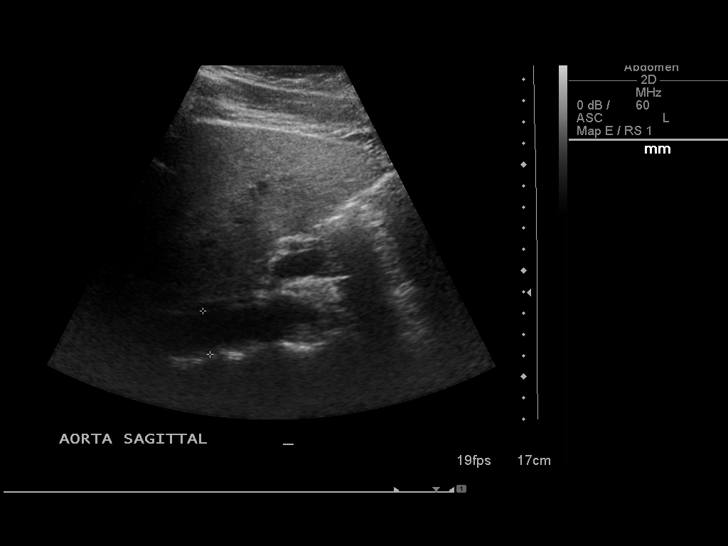
[im 12/68]
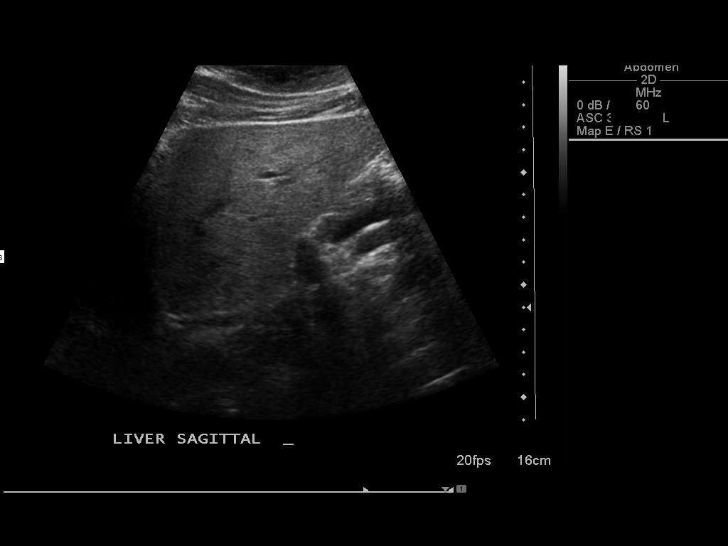
[im 17/68]
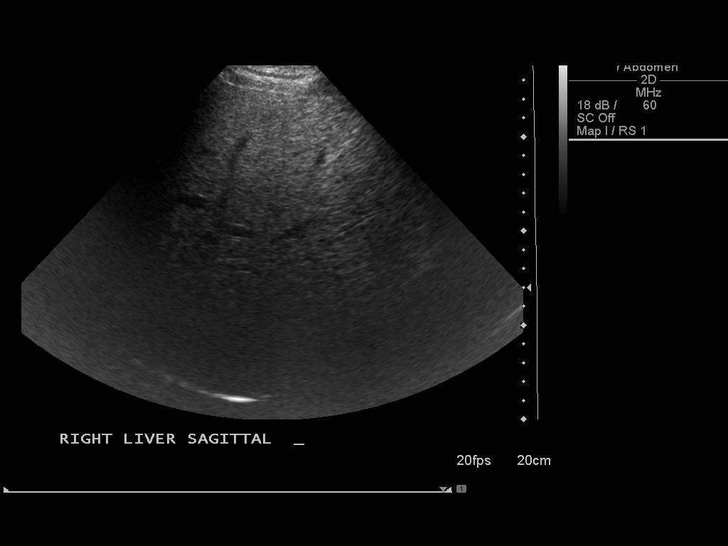
[im 23/68]
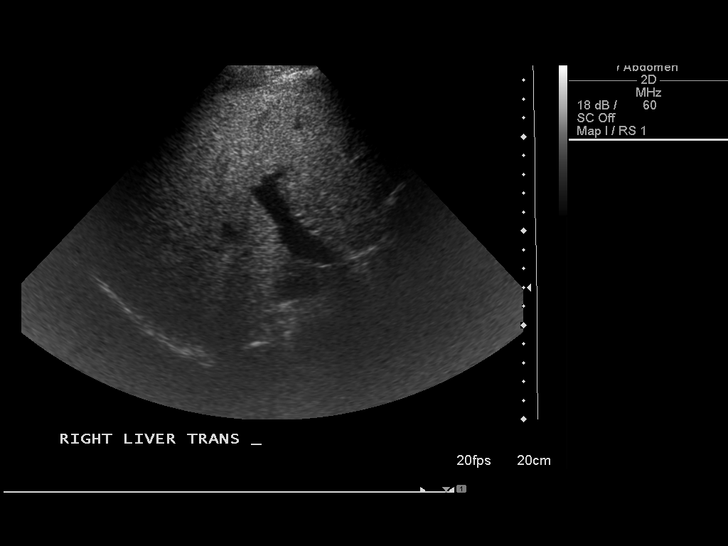
[im 26/68]
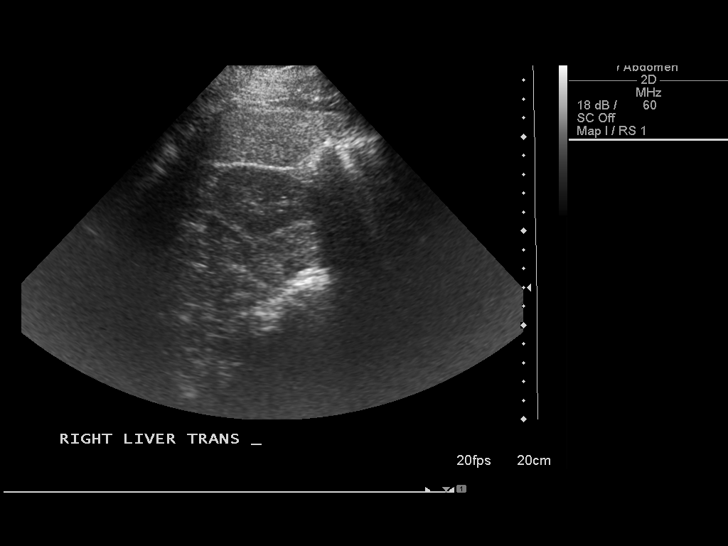
[im 31/68]
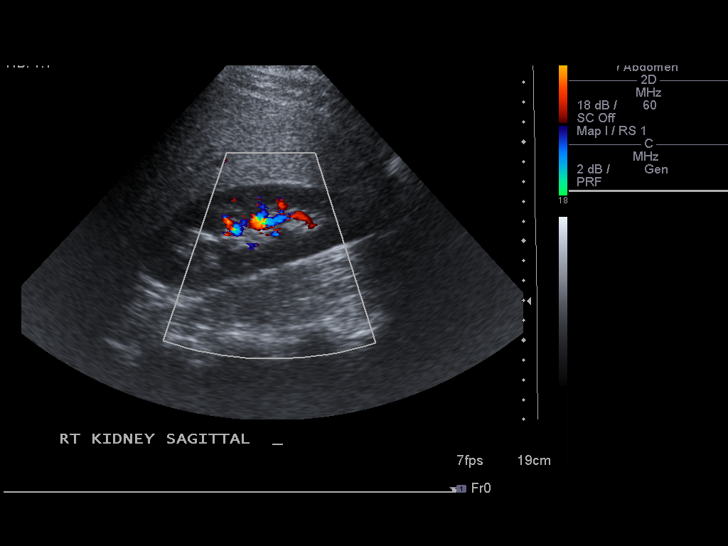
[im 37/68]
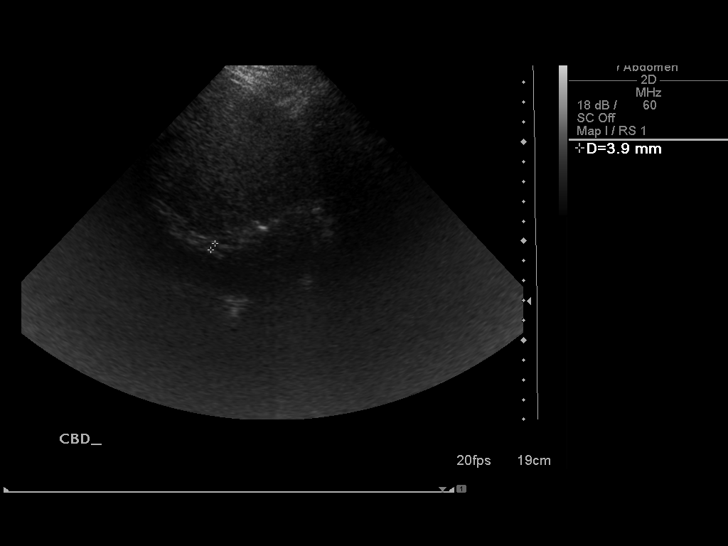
[im 42/68]
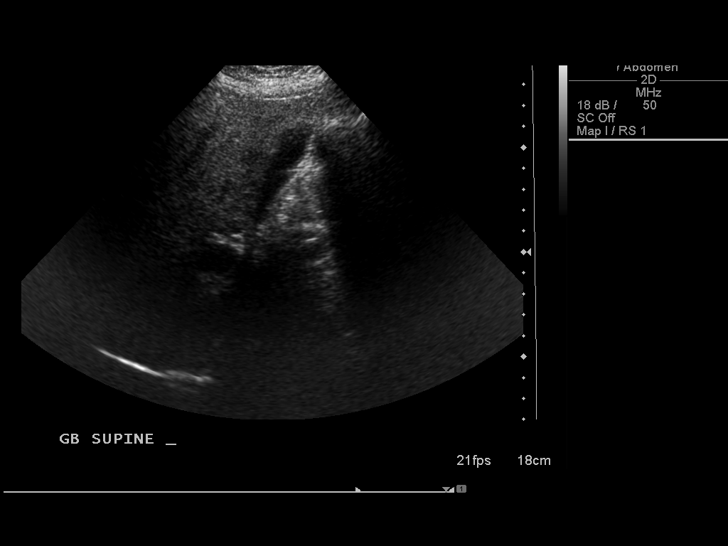
[im 45/68]
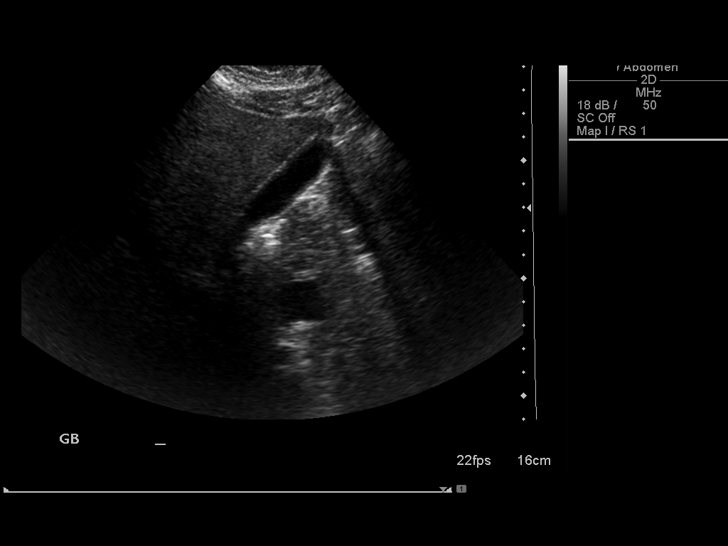
[im 51/68]
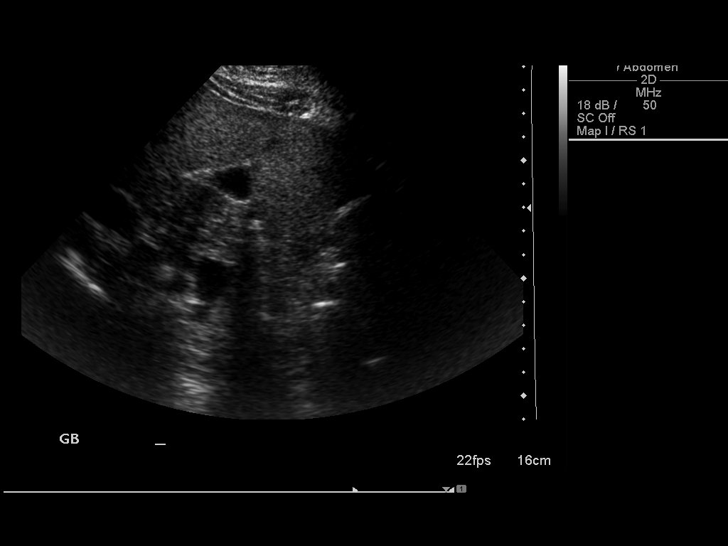
[im 56/68]
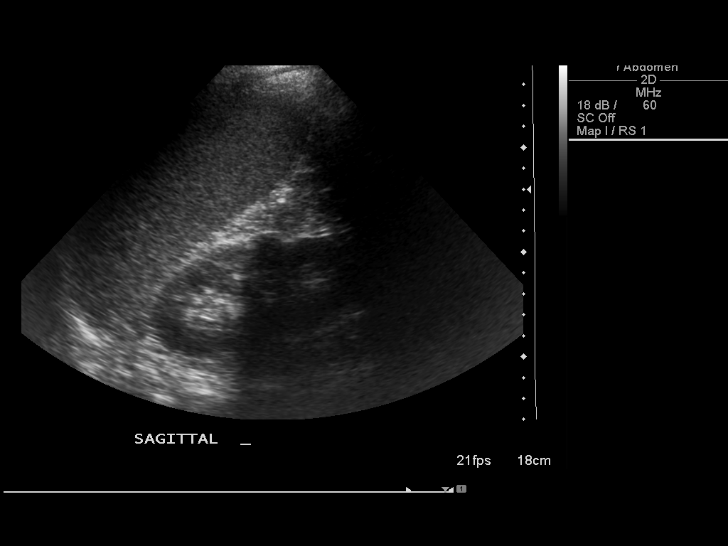
[im 62/68]
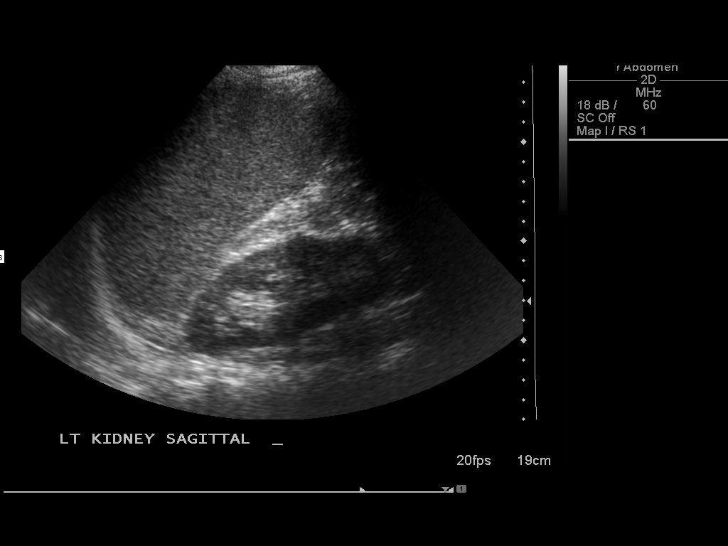
[im 68/68]
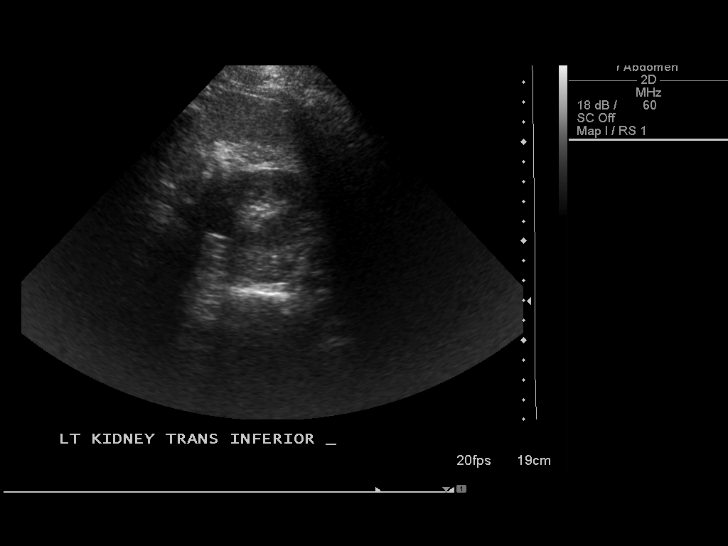

[14 of 25 positions shown; findings below may reference images not displayed]

FINDINGS: Gallbladder is visualized in multiple projections.
Gallbladder appears somewhat contracted.  No gallstones are seen.
There is no gallbladder wall thickening or pericholecystic fluid
collection.  There is no intrahepatic, common hepatic, or common
bile duct dilatation.  Pancreas appears normal.

Liver is enlarged, measuring slightly greater than 18 cm in length.
There is increased echogenicity throughout the liver consistent
with fatty change.  No focal liver lesions are identified.

Spleen is normal in size and homogeneous echotexture.  Kidneys
bilaterally appear normal.  There is no ascites.  Aorta is
nonaneurysmal.  Inferior vena cava appears normal.
CONCLUSION: Liver is enlarged with diffuse fatty change.  While no
focal liver lesions are identified by ultrasound, it must be
cautioned that the sensitivity of ultrasound for more subtle liver
lesions is diminished significantly in this circumstance.

Study otherwise unremarkable.  Gallbladder does appear mildly
contracted but otherwise unremarkable.

## 2013-09-03 ENCOUNTER — Telehealth: Payer: Self-pay

## 2013-09-03 NOTE — Telephone Encounter (Signed)
Called patient to schedule 6 month follow-up and patient states he cannot schedule at this time. He just started a new job and does not have insurance at this moment. Patient states if we just pushed his follow up back 2 months then he can schedule. I told him I will speak with Dr. Russella Dar and call him back. Please advise if this ok.

## 2013-09-03 NOTE — Telephone Encounter (Signed)
Left a message for patient to return my call. 

## 2013-09-03 NOTE — Telephone Encounter (Signed)
OK for this one time. In general I need to see him at least every 6 months while on his current treatment.

## 2013-09-04 NOTE — Telephone Encounter (Signed)
Left a message for patient to return my call. 

## 2013-09-06 ENCOUNTER — Telehealth: Payer: Self-pay

## 2013-09-06 NOTE — Telephone Encounter (Signed)
Left a message stating that he has to come in for a follow up visit in October for his 6 month follow up since he is being treated with Humira. Told patient that unfortunately if he does not come in for a follow up visit then we cannot refill his Humira for another 6 months. Told patient to call me or schedule his office visit appointment as soon as he can.

## 2013-09-06 NOTE — Telephone Encounter (Signed)
Patient called and states he is sorry he has not got back to me to schedule an appointment but he has been working at his new job. Zo states he actually has to confess that he has not been giving himself his Humira injections for the last 2-3 months. Asked patient when his last injection date was and patient states he cannot remember but it has been for at least 2-3 months. Asked patient why and patient states for personal reasons he could not get to his injections. He did not elaborate. I told him that this is serious medication and should not have been stopped by him on his own. Patient due to personal reasons he could not help that he stopped the medication but now he is not having any GI symptoms. I asked patient again if he is having ANY GI symptoms and patient then states he has had some intermittent diarrhea but he states he can live with that. Also he states it could have been something that he ate. Told patient that even though he has stopped Humira against Dr. Ardell Isaacs recommendations, he still needs to be seen in the office for follow up because of his disease. Patient agreed but states he cannot schedule at this time because he has no insurance and cannot afford to come into the office. Patient wants to me to let Dr. Russella Dar know that he is sorry. I told patient that I will inform Dr. Russella Dar but this is going against his recommendations.

## 2013-09-10 NOTE — Telephone Encounter (Signed)
Message Received: 3 days ago     From: Meryl Dare, MD   To: Christie Nottingham, CMA            I read your note regarding his noncompliance. He didn't really make his reasons completely clear.  Unfortunately we need to discharge him from the practice for noncompliance with recommended medications and follow up.

## 2013-09-11 ENCOUNTER — Telehealth: Payer: Self-pay | Admitting: Gastroenterology

## 2013-09-11 NOTE — Telephone Encounter (Signed)
Dismissal Letter sent by Certified Mail 09/11/2013  Received the Return Receipt showing someone picked up the Dismissal 09/18/2013

## 2014-05-02 ENCOUNTER — Encounter (HOSPITAL_BASED_OUTPATIENT_CLINIC_OR_DEPARTMENT_OTHER): Payer: Self-pay

## 2014-05-02 ENCOUNTER — Emergency Department (HOSPITAL_BASED_OUTPATIENT_CLINIC_OR_DEPARTMENT_OTHER)
Admission: EM | Admit: 2014-05-02 | Discharge: 2014-05-02 | Disposition: A | Discharge status: Home / Self Care | Payer: Managed Care, Other (non HMO) | Attending: Emergency Medicine | Admitting: Emergency Medicine

## 2014-05-02 DIAGNOSIS — F329 Major depressive disorder, single episode, unspecified: Secondary | ICD-10-CM | POA: Insufficient documentation

## 2014-05-02 DIAGNOSIS — L739 Follicular disorder, unspecified: Secondary | ICD-10-CM | POA: Diagnosis not present

## 2014-05-02 DIAGNOSIS — Z008 Encounter for other general examination: Secondary | ICD-10-CM | POA: Diagnosis present

## 2014-05-02 DIAGNOSIS — Z79899 Other long term (current) drug therapy: Secondary | ICD-10-CM | POA: Insufficient documentation

## 2014-05-02 DIAGNOSIS — F1110 Opioid abuse, uncomplicated: Secondary | ICD-10-CM | POA: Insufficient documentation

## 2014-05-02 DIAGNOSIS — Z87891 Personal history of nicotine dependence: Secondary | ICD-10-CM | POA: Diagnosis not present

## 2014-05-02 DIAGNOSIS — F1310 Sedative, hypnotic or anxiolytic abuse, uncomplicated: Secondary | ICD-10-CM | POA: Diagnosis not present

## 2014-05-02 DIAGNOSIS — K219 Gastro-esophageal reflux disease without esophagitis: Secondary | ICD-10-CM | POA: Insufficient documentation

## 2014-05-02 LAB — RAPID URINE DRUG SCREEN, HOSP PERFORMED
AMPHETAMINES: NOT DETECTED
BARBITURATES: NOT DETECTED
BENZODIAZEPINES: POSITIVE — AB
Cocaine: NOT DETECTED
Opiates: POSITIVE — AB
Tetrahydrocannabinol: NOT DETECTED

## 2014-05-02 NOTE — Discharge Instructions (Signed)
Folliculitis   At this time the patient's folliculitis is healing with dry, flat scars. There are no active pustules or drainage. The patient is cleared to return to group treatment facilities of [facility].  Folliculitis is redness, soreness, and swelling (inflammation) of the hair follicles. This condition can occur anywhere on the body. People with weakened immune systems, diabetes, or obesity have a greater risk of getting folliculitis. CAUSES  Bacterial infection. This is the most common cause.  Fungal infection.  Viral infection.  Contact with certain chemicals, especially oils and tars. Long-term folliculitis can result from bacteria that live in the nostrils. The bacteria may trigger multiple outbreaks of folliculitis over time. SYMPTOMS Folliculitis most commonly occurs on the scalp, thighs, legs, back, buttocks, and areas where hair is shaved frequently. An early sign of folliculitis is a small, white or yellow, pus-filled, itchy lesion (pustule). These lesions appear on a red, inflamed follicle. They are usually less than 0.2 inches (5 mm) wide. When there is an infection of the follicle that goes deeper, it becomes a boil or furuncle. A group of closely packed boils creates a larger lesion (carbuncle). Carbuncles tend to occur in hairy, sweaty areas of the body. DIAGNOSIS  Your caregiver can usually tell what is wrong by doing a physical exam. A sample may be taken from one of the lesions and tested in a lab. This can help determine what is causing your folliculitis. TREATMENT  Treatment may include:  Applying warm compresses to the affected areas.  Taking antibiotic medicines orally or applying them to the skin.  Draining the lesions if they contain a large amount of pus or fluid.  Laser hair removal for cases of long-lasting folliculitis. This helps to prevent regrowth of the hair. HOME CARE INSTRUCTIONS  Apply warm compresses to the affected areas as directed by your  caregiver.  If antibiotics are prescribed, take them as directed. Finish them even if you start to feel better.  You may take over-the-counter medicines to relieve itching.  Do not shave irritated skin.  Follow up with your caregiver as directed. SEEK IMMEDIATE MEDICAL CARE IF:   You have increasing redness, swelling, or pain in the affected area.  You have a fever. MAKE SURE YOU:  Understand these instructions.  Will watch your condition.  Will get help right away if you are not doing well or get worse. Document Released: 02/28/2001 Document Revised: 06/21/2011 Document Reviewed: 03/22/2011 [facility] Patient Information 2015 [place], [place]. This information is not intended to replace advice given to you by your health care provider. Make sure you discuss any questions you have with your health care provider. Emergency Department Resource Guide 1) Find a Doctor and Pay Out of Pocket Although you won't have to find out who is covered by your insurance plan, it is a good idea to ask around and get recommendations. You will then need to call the office and see if the doctor you have chosen will accept you as a new patient and what types of options they offer for patients who are self-pay. Some doctors offer discounts or will set up payment plans for their patients who do not have insurance, but you will need to ask so you aren't surprised when you get to your appointment.  2) Contact Your Local Health Department Not all health departments have doctors that can see patients for sick visits, but many do, so it is worth a call to see if yours does. If you don't know where your local  health department is, you can check in your phone book. The CDC also has a tool to help you locate your state's health department, and many state websites also have listings of all of their local health departments.  3) Find a Walk-in Clinic If your illness is not likely to be very severe or complicated, you may  want to try a walk in clinic. These are popping up all over the country in pharmacies, drugstores, and shopping centers. They're usually staffed by nurse practitioners or physician assistants that have been trained to treat common illnesses and complaints. They're usually fairly quick and inexpensive. However, if you have serious medical issues or chronic medical problems, these are probably not your best option.  No Primary Care Doctor: - Call Health Connect at  [phone] - they can help you locate a primary care doctor that  accepts your insurance, provides certain services, etc. - Physician Referral Service- [phone]  Chronic Pain Problems: Organization         Address  Phone   Notes  [facility]  [phone] Patients need to be referred by their primary care doctor.   Medication Assistance: Organization         Address  Phone   Notes  [facility] Medication [facility] [address] [place] [place], [place] [address] [phone] --Must be a resident of [facility] -- Must have NO insurance coverage whatsoever (no Medicaid/ Medicare, etc.) -- The pt. MUST have a primary care doctor that directs their care regularly and follows them in the community   [facility]  [phone]   [employer]  [phone]    Agencies that provide inexpensive medical care: Organization         Address  Phone   Notes  [facility]  [phone]   [facility]    [phone]   [facility] [address] [place], [place] [address] [phone]   [facility] [address] [place] [phone]   Planned Parenthood    [phone]   [facility]    [phone]   [facility]  [address], [place] Phone:  [phone], Fax:  [phone] Hours of Operation:  9 am - 6 pm, M-F.  Also accepts Medicaid/Medicare and self-pay.   [facility]  [address], [place] Phone: [phone], Fax: [phone]. Hours of Operation:  8:30 am - 5:30 pm, M-F.  Also accepts Medicaid and self-pay.  [facility] [place] [address], [place] Phone: [phone]   [facility] [address] Natasha Bence [place], [place] [phone], Ext. [phone] Mondays & Thursdays: 7-9 AM.  First 15 patients are seen on a first come, first serve basis.    Medicaid-accepting [facility] Providers:  Organization         Address  Phone   Notes  [facility] [address], [place] [phone] Also accepts self-pay patients.  [facility] [address] Laurell Josephs [place], [place]  [phone]   [facility] [address], [place] [phone]   [facility] [address], [place] [phone]   Renaye Rakers [address], [place]   [phone] Only accepts [place] Access [place] patients after they  have their name applied to their card.   Self-Pay (no insurance) in [facility]:  Organization         Address  Phone   Notes  Sickle Cell Patients, [facility] Internal Medicine [address] [place], [place] [phone]   [facility] Urgent Care [address] [place], [place] [phone]   [facility] [place]  [address], [place] [phone]   [facility]/Dr. Osei-Bonsu  [address] [place] or [address], [place] [phone] Phone number for both [place] and [place] locations is the same.  [facility] [address], [place] [phone]   [facility] [address] [place] or [address] [phone]   [facility] [address], [place] [phone], phone; [phone], fax Sees patients 1st and 3rd Saturday of every month.  Must not qualify for public or private insurance (i.e. Medicaid, Medicare, [place] Health Choice, Veterans' Benefits)  Household income should be no more than 200% of the poverty level The clinic cannot treat you if you are pregnant or think you are pregnant  Sexually transmitted diseases are not treated at the clinic.    Dental Care: Organization         Address  Phone  Notes  [facility] Department of [facility] [address] [place], [place] [phone] Accepts children up to age 52 who are enrolled in [place] or [place] Health Choice; pregnant women with a Medicaid card; and children who have applied for Medicaid or [place] Health Choice, but were declined, whose parents can pay a reduced fee at time of service.  [facility] of [facility]  [address], [place] [phone] Accepts children up to age 46 who are enrolled in [place] or [place] Health Choice; pregnant women with a Medicaid card; and children who have applied for Medicaid or [place] Health Choice, but were declined, whose parents can pay a reduced fee at time of service.  [facility]  [address] [place], [place] [phone] Patients are seen by appointment only. Walk-ins are not accepted. [facility] will see patients 21 years of age and older. Monday - Tuesday (8am-5pm) Most Wednesdays (8:30-5pm) $30 per visit, cash only  [facility] Adult Dental Access PROGRAM  [address], [facility] [phone] Patients are seen by appointment only. Walk-ins are not accepted. [facility] will see patients 31 years of age and older. One Wednesday Evening (Monthly: Volunteer Based).  $30 per visit, cash only  [employer]  [phone] for adults; Children under age 6, call Graduate Pediatric Dentistry at [phone]. Children aged 100-14, please call [phone] to request a pediatric application.  Dental services are provided in all areas of dental care including fillings, crowns and bridges, complete and partial dentures, implants, gum treatment, root canals, and extractions. Preventive care is also provided. Treatment is provided to both adults and children. Patients are selected via a lottery and there is often a waiting list.   [facility] [address] [place]  [phone] [email or web address]   [facility] [address] [place], [place] [phone], Ext. [phone] Second and Fourth Thursday of each month, opens at 6:30 AM; Clinic ends at 9 AM.  Patients are seen  on a first-come first-served basis, and a limited number are seen during each clinic.   [facility]  [address] Ether Griffins [place], [place] [phone]   Eligibility Requirements You must have lived in [place], [place], or [place] counties for at least the last three months.   You cannot be eligible for state or federal sponsored [place], including [employer], [place], or [employer].   You generally cannot be eligible for healthcare insurance through your employer.    How to apply: Eligibility screenings are held every Tuesday and Wednesday afternoon from 1:00 pm until 4:00 pm. You do not need an appointment for the interview!  [facility] [address], [place], [place] [phone]   [facility] Health Department  [phone]   [facility] Health Department  [phone]   [facility] Health Department  [phone]    Behavioral Health Resources in the Community: Intensive Outpatient Programs Organization         Address  Phone  Notes  [facility] [address] [place], [place] [phone]   [facility] Outpatient [address] [place], [place] [phone]   [facility] [address], [place], [place]  [phone]    [facility] Mental Health [address]  [place], [place] [phone] or [phone]   Substance Abuse Resources Organization         Address  Phone  Notes  [facility]  [phone]   [facility]  [phone]   [facility] [place]  [phone]   Floydene Flock  [phone]   Residential & Outpatient Substance Abuse Program  [phone]   Psychological Services Organization         Address  Phone  Notes  [facility] Behavioral Health  [phone]   [facility] Services  [phone]   [facility] Mental Health [address], [place] [phone] or [phone]    Mobile Crisis Teams Organization         Address  Phone  Notes  [facility], Mobile Crisis Care Unit  [phone]   [facility]  [address] [place], [place] [phone]   Doristine Locks [address] [place] [phone]    Self-Help/Support Groups Organization         Address  Phone             Notes  [facility] [place] - variety of support groups  336- I7437963 Call for more information  Narcotics Anonymous (NA), [facility] [address], [employer] [place]  2 meetings at this location   Statistician         Address  Phone  Notes  [facility] [address],    [place]  [phone]   [facility]  [address] [place] [address], [place], [place] [phone]   [facility] [address] [place], [place] [phone] Admissions: 8am-3pm M-F  [facility] [address],    [place], [place] [phone]   [facility] [address] Starling Manns [place], [place] [phone]   [facility] [address],  [place], [place] [phone]   [facility] - Intensive Outpatient [address] Laurell Joseph[address], [place], [place] [phone]   [facility] (Addiction Recovery [facility] [address] [place]  [place], [place] [phone] or [phone]   [facility] ([facility]) [address] [place],  [place] [phone] Accepts Medicaid  [facility] [address]  [place] [phone] Substance Abuse/Addiction Treatment   [facility] Organization         Address  Phone  Notes  [facility]  [phone]   Angie Fava, PhD [address] Ervin Knack [place], [place]   [phone] or [phone]   [facility]al   [address] [place], [place] [phone]   [facility] [address] [place], [place] [phone] Insurance/Medicaid/sponsorship through [facility] and Families [address]                                    [place], [place] [phone] Therapy/tele-psych/case  [facility] [address], [place] [phone]    Dr. Lolly Mustache  [phone]   [facility] [place]  [employer] [facility] Dept. 1) [address]) [address] [place] 3)  [address], [place] [phone]  [phone]   [facility] Child Abuse Hotline 774-698-0771 or [phone] (After Hours)

## 2014-05-02 NOTE — ED Notes (Signed)
Pt here attempting to get into [facility].  Was sent here due to rash on bilateral arms and legs and needs medical clearance saying he is not contagious and also a referral to [facility].

## 2014-05-02 NOTE — ED Provider Notes (Signed)
CSN: [id number]     Arrival date & time 05/02/14  1853 History  This chart was scribed for Arby Barrette, MD by Abel Presto, ED Scribe. This patient was seen in room MHT13/MHT13 and the patient's care was started at 9:25 PM.    Chief Complaint  Patient presents with  . Medical Clearance     The history is provided by the patient. No language interpreter was used.   HPI Comments: Manuel Hanna is a 24 y.o. male with PMHx of GERD, fatty liver, Crohn's disease, substance abuse, and IBS who presents to the Emergency Department for medical clearance. Pt states he was trying to get into [facility] but states they will not accept him because he presented with folliculitis on bilateral upper and lower extremities. Pt has paperwork with him showing results of biopsy. He would like clearance to indicate that he is not contagious. Pt denies fever and chills.  Past Medical History  Diagnosis Date  . GERD (gastroesophageal reflux disease)   . Fatty liver   . Crohn's disease 2013    ileocolitis  . Regional enteritis of unspecified site   . IBS (irritable bowel syndrome)   . Hiatal hernia   . Depression   . Substance abuse     Heroin    Past Surgical History  Procedure Laterality Date  . Appendectomy     Family History  Problem Relation Age of Onset  . Heart disease Father   . Diabetes type II Father   . Depression Father   . Diabetes Father   . Asthma Father   . Cancer Father   . Colon cancer Father   . Esophageal cancer Mother    History  Substance Use Topics  . Smoking status: Former Smoker -- 1.00 packs/day for 6 years    Types: Cigarettes    Quit date: 09/03/2012  . Smokeless tobacco: Never Used  . Alcohol Use: No     Comment: Rarely     Review of Systems 10 Systems reviewed and all are negative for acute change except as noted in the HPI.     Allergies  Shellfish allergy  Home Medications   Prior to Admission medications   Medication Sig Start Date End Date  Taking? Authorizing Provider  LORazepam (ATIVAN) 1 MG tablet Take 1 mg by mouth every 8 (eight) hours.   Yes Historical Provider, MD  Adalimumab (HUMIRA PEN [place]) Inject 40 mg into the skin every 14 (fourteen) days.    Historical Provider, MD  DULoxetine (CYMBALTA) 60 MG capsule Take 1 capsule (60 mg total) by mouth daily. 03/06/13 03/07/14  Gillian Scarce, MD  pantoprazole (PROTONIX) 40 MG tablet Take 1 tablet (40 mg total) by mouth daily. 07/31/12 07/31/13  Gillian Scarce, MD  promethazine (PHENERGAN) 25 MG tablet Take 1/4 - 1 tab po q 4-6 hours 03/06/13 03/07/14  Gillian Scarce, MD  ranitidine (ZANTAC) 150 MG tablet Take 1 tablet (150 mg total) by mouth 2 (two) times daily. 03/06/13 03/07/14  Gillian Scarce, MD   BP 142/91 mmHg  Pulse 102  Temp(Src) 98.3 F (36.8 C) (Oral)  Resp 18  Ht  (1.88 m)  Wt 205 lb (92.987 kg)  BMI 26.31 kg/m2  SpO2 98% Physical Exam  Constitutional: He is oriented to person, place, and time. He appears well-developed and well-nourished. No distress.  HENT:  Head: Normocephalic and atraumatic.  Eyes: Conjunctivae and EOM are normal. Right eye exhibits no discharge. Left eye exhibits no discharge.  Pulmonary/Chest: Effort normal.  Musculoskeletal:  All movements are coordinated and purposeful without difficulty. Patient is sitting upright on the stretcher. He can stand and ambulate without limitation. No peripheral edema.  Neurological: He is alert and oriented to person, place, and time. Coordination normal.  Skin: Skin is warm and dry.  Skin is warm and dry. He has multiple dry based combination of round eschar's and scars on his forearms predominantly and some on the lower legs. There are a few on the face. These are consistent with a resolving folliculitis and probable picking behavior history. None have pustules or vesicles associated. All are dry based.  Psychiatric: He has a normal mood and affect.    ED Course  Procedures (including critical care  time) DIAGNOSTIC STUDIES: Oxygen Saturation is 98% on room air, normal by my interpretation.    COORDINATION OF CARE: 9:30 PM Discussed treatment plan with patient at beside, the patient agrees with the plan and has no further questions at this time.   Labs Review Labs Reviewed  URINE RAPID DRUG SCREEN (HOSP PERFORMED) - Abnormal; Notable for the following:    Opiates POSITIVE (*)    Benzodiazepines POSITIVE (*)    All other components within normal limits    Imaging Review No results found.   EKG Interpretation None     Results for orders placed or performed during the hospital encounter of 05/02/14  Drug screen panel, emergency  Result Value Ref Range   Opiates POSITIVE (A) NONE DETECTED   Cocaine NONE DETECTED NONE DETECTED   Benzodiazepines POSITIVE (A) NONE DETECTED   Amphetamines NONE DETECTED NONE DETECTED   Tetrahydrocannabinol NONE DETECTED NONE DETECTED   Barbiturates NONE DETECTED NONE DETECTED   No results found.   MDM   Final diagnoses:  Folliculitis   Patient presents only for clearance to proceed [facility] because of this rash he has been required to get a medical clearance for non-contagiousness. He actually has a dermatologist report stating that the rash is been evaluated and it is folliculitis by biopsy. At this time all of the lesions are clean and dry and healing. 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 There should not be risk of contagiousness at this point and the patient is cleared to return to his ongoing therapy plan.    Arby Barrette, MD 05/08/14 0730

## 2022-12-19 ENCOUNTER — Emergency Department (HOSPITAL_COMMUNITY): Payer: Self-pay

## 2022-12-19 ENCOUNTER — Encounter (HOSPITAL_COMMUNITY): Payer: Self-pay

## 2022-12-19 ENCOUNTER — Other Ambulatory Visit: Payer: Self-pay

## 2022-12-19 ENCOUNTER — Emergency Department (HOSPITAL_COMMUNITY)
Admission: EM | Admit: 2022-12-19 | Discharge: 2022-12-19 | Disposition: A | Discharge status: Home / Self Care | Payer: Worker's Compensation | Attending: Student | Admitting: Student

## 2022-12-19 DIAGNOSIS — S0990XA Unspecified injury of head, initial encounter: Secondary | ICD-10-CM | POA: Diagnosis not present

## 2022-12-19 DIAGNOSIS — Y990 Civilian activity done for income or pay: Secondary | ICD-10-CM | POA: Diagnosis not present

## 2022-12-19 DIAGNOSIS — W19XXXA Unspecified fall, initial encounter: Secondary | ICD-10-CM | POA: Insufficient documentation

## 2022-12-19 DIAGNOSIS — R519 Headache, unspecified: Secondary | ICD-10-CM | POA: Diagnosis present

## 2022-12-19 DIAGNOSIS — M542 Cervicalgia: Secondary | ICD-10-CM | POA: Diagnosis not present

## 2022-12-19 MED ORDER — ACETAMINOPHEN 325 MG PO TABS
650.0000 mg | ORAL_TABLET | Freq: Once | ORAL | Status: AC
  Administered 2022-12-19: 650 mg via ORAL
  Filled 2022-12-19: qty 2

## 2022-12-19 MED ORDER — CYCLOBENZAPRINE HCL 10 MG PO TABS
5.0000 mg | ORAL_TABLET | Freq: Once | ORAL | Status: AC
  Administered 2022-12-19: 5 mg via ORAL
  Filled 2022-12-19: qty 1

## 2022-12-19 NOTE — ED Provider Notes (Signed)
[facility] Provider Note   CSN: [id number] Arrival date & time: 12/19/22  1717     History  Chief Complaint  Patient presents with   Neck Pain   Back Pain    Manuel Hanna is a 32 y.o. male, no past medical history, presents to the ED secondary to headache, neck pain, has been persistent, since he fell on Friday.  He states he tripped over a pallet at work, fell backwards, onto his buttock, and hit his head, and neck against hard ground.  He states since then he has had severe neck pain, and headache.  Denies any nausea, vomiting.  No loss of consciousness, or use of blood thinners.  Went to urgent care and was told to come to the ER to get a CAT scan.  Home Medications Prior to Admission medications   Medication Sig Start Date End Date Taking? Authorizing Provider  Adalimumab (HUMIRA PEN [place]) Inject 40 mg into the skin every 14 (fourteen) days.    [provider]  DULoxetine (CYMBALTA) 60 MG capsule Take 1 capsule (60 mg total) by mouth daily. 03/06/13 03/07/14  Zanard, Hinton Dyer, MD  LORazepam (ATIVAN) 1 MG tablet Take 1 mg by mouth every 8 (eight) hours.    [provider]  pantoprazole (PROTONIX) 40 MG tablet Take 1 tablet (40 mg total) by mouth daily. 07/31/12 07/31/13  Gillian Scarce, MD  promethazine (PHENERGAN) 25 MG tablet Take 1/4 - 1 tab po q 4-6 hours 03/06/13 03/07/14  Zanard, Hinton Dyer, MD  ranitidine (ZANTAC) 150 MG tablet Take 1 tablet (150 mg total) by mouth 2 (two) times daily. 03/06/13 03/07/14  Gillian Scarce, MD      Allergies    Shellfish allergy    Review of Systems   Review of Systems  Musculoskeletal:  Positive for neck pain.  Neurological:  Positive for headaches.    Physical Exam Updated Vital Signs BP (!) 158/103 (BP Location: Right Arm)   Pulse (!) 102   Temp 97.8 F (36.6 C) (Oral)   Resp 19   Ht 6\' 2"  (1.88 m)   Wt 124.3 kg   SpO2 100%   BMI 35.18 kg/m  Physical Exam Vitals and nursing  note reviewed.  Constitutional:      General: He is not in acute distress.    Appearance: He is well-developed.  HENT:     Head: Normocephalic and atraumatic.  Eyes:     Conjunctiva/sclera: Conjunctivae normal.  Cardiovascular:     Rate and Rhythm: Normal rate and regular rhythm.     Heart sounds: No murmur heard. Pulmonary:     Effort: Pulmonary effort is normal. No respiratory distress.     Breath sounds: Normal breath sounds.  Abdominal:     Palpations: Abdomen is soft.     Tenderness: There is no abdominal tenderness.  Musculoskeletal:        General: No swelling.     Cervical back: Neck supple.     Comments: TTP of cervical spine midline, w/paraspinal ttp. No ttp of lumbar or thoracic spine. No erythema or edema or ecchymoses noted.  Skin:    General: Skin is warm and dry.     Capillary Refill: Capillary refill takes less than 2 seconds.  Neurological:     Mental Status: He is alert.     Cranial Nerves: Cranial nerves 2-12 are intact.     Sensory: Sensation is intact.     Motor:  Motor function is intact.     Comments: No neurodeficits noted  Psychiatric:        Mood and Affect: Mood normal.     ED Results / Procedures / Treatments   Labs (all labs ordered are listed, but only abnormal results are displayed) Labs Reviewed - No data to display  EKG None  Radiology CT Head Wo Contrast Result Date: 12/19/2022 CLINICAL DATA:  Head trauma, moderate-severe; Neck trauma, midline tenderness (Age 52-64y) EXAM: CT HEAD WITHOUT CONTRAST CT CERVICAL SPINE WITHOUT CONTRAST TECHNIQUE: Multidetector CT imaging of the head and cervical spine was performed following the standard protocol without intravenous contrast. Multiplanar CT image reconstructions of the cervical spine were also generated. RADIATION DOSE REDUCTION: This exam was performed according to the departmental dose-optimization program which includes automated exposure control, adjustment of the mA and/or kV according  to patient size and/or use of iterative reconstruction technique. COMPARISON:  None Available. FINDINGS: CT HEAD FINDINGS Brain: No hemorrhage. No hydrocephalus. No extra-axial fluid collection. No CT evidence of an acute cortical infarct. No mass effect. No mass lesion. Vascular: No hyperdense vessel or unexpected calcification. Skull: Normal. Negative for fracture or focal lesion. Sinuses/Orbits: No middle ear or mastoid effusion. Paranasal sinuses are clear. Orbits are unremarkable. Other: None. CT CERVICAL SPINE FINDINGS Alignment: Normal. Skull base and vertebrae: No acute fracture. No primary bone lesion or focal pathologic process. Soft tissues and spinal canal: No prevertebral fluid or swelling. No visible canal hematoma. Disc levels:  No evidence of high-grade spinal canal stenosis Upper chest: Negative. Other: None IMPRESSION: 1. No CT evidence of intracranial injury. 2. No acute fracture or traumatic subluxation of the cervical spine. Electronically Signed   By: Lorenza Cambridge M.D.   On: 12/19/2022 20:32   CT Cervical Spine Wo Contrast Result Date: 12/19/2022 CLINICAL DATA:  Head trauma, moderate-severe; Neck trauma, midline tenderness (Age 57-64y) EXAM: CT HEAD WITHOUT CONTRAST CT CERVICAL SPINE WITHOUT CONTRAST TECHNIQUE: Multidetector CT imaging of the head and cervical spine was performed following the standard protocol without intravenous contrast. Multiplanar CT image reconstructions of the cervical spine were also generated. RADIATION DOSE REDUCTION: This exam was performed according to the departmental dose-optimization program which includes automated exposure control, adjustment of the mA and/or kV according to patient size and/or use of iterative reconstruction technique. COMPARISON:  None Available. FINDINGS: CT HEAD FINDINGS Brain: No hemorrhage. No hydrocephalus. No extra-axial fluid collection. No CT evidence of an acute cortical infarct. No mass effect. No mass lesion. Vascular: No  hyperdense vessel or unexpected calcification. Skull: Normal. Negative for fracture or focal lesion. Sinuses/Orbits: No middle ear or mastoid effusion. Paranasal sinuses are clear. Orbits are unremarkable. Other: None. CT CERVICAL SPINE FINDINGS Alignment: Normal. Skull base and vertebrae: No acute fracture. No primary bone lesion or focal pathologic process. Soft tissues and spinal canal: No prevertebral fluid or swelling. No visible canal hematoma. Disc levels:  No evidence of high-grade spinal canal stenosis Upper chest: Negative. Other: None IMPRESSION: 1. No CT evidence of intracranial injury. 2. No acute fracture or traumatic subluxation of the cervical spine. Electronically Signed   By: Lorenza Cambridge M.D.   On: 12/19/2022 20:32    Procedures Procedures    Medications Ordered in ED Medications  acetaminophen (TYLENOL) tablet 650 mg (650 mg Oral Given 12/19/22 1855)  cyclobenzaprine (FLEXERIL) tablet 5 mg (5 mg Oral Given 12/19/22 1855)    ED Course/ Medical Decision Making/ A&P  Medical Decision Making Patient is a 32 year old male, here for neck pain, headache, after falling after tripping on a pallet few days ago.  He has cervical spine tenderness, and was told to come to the ER for CT scans.  Will obtain CT head/neck, given trauma.  Tylenol, Flexeril for pain control  Amount and/or Complexity of Data Reviewed Radiology: ordered.    Details: No acute abnormality noted Discussion of management or test interpretation with external provider(s): Discussed with patient, head CT and neck CT within normal limits.  Likely represents cervical sprain, versus just a contusion.  Instructed to take Tylenol ibuprofen at home.  Return precautions emphasized  Risk OTC drugs. Prescription drug management.    Final Clinical Impression(s) / ED Diagnoses Final diagnoses:  Neck pain  Traumatic injury of head, initial encounter  Acute nonintractable headache,  unspecified headache type    Rx / DC Orders ED Discharge Orders     None         Pete Pelt, PA 12/19/22 2051    Glendora Score, MD 12/20/22 1159

## 2022-12-19 NOTE — Discharge Instructions (Addendum)
Please take ibuprofen and Tylenol for your pain control.  Return if you feel like your symptoms are worsening.  Your CT today was reassuring, there were no acute abnormalities.  You likely does have a bruising,/cervical sprain, which is causing this pain.

## 2022-12-19 NOTE — ED Triage Notes (Addendum)
Pt arrives via POV. Pt reports he fell at work on Friday. Was seen at an UC. He reports he is still having pain in his head, neck, and upper back. Pt AxOx4. CMS intact. UC recommended a CT of his head and neck. No cervical tenderness, stepoff or deformity noted.
